# Patient Record
Sex: Female | Born: 1985
Health system: Southern US, Community
[De-identification: ages and names within clinical notes are randomized; demographics above are authoritative.]

## PROBLEM LIST (undated history)

## (undated) DIAGNOSIS — Z801 Family history of malignant neoplasm of trachea, bronchus and lung: Secondary | ICD-10-CM

## (undated) DIAGNOSIS — Z800 Family history of malignant neoplasm of digestive organs: Secondary | ICD-10-CM

## (undated) DIAGNOSIS — Z83719 Family history of colon polyps, unspecified: Secondary | ICD-10-CM

## (undated) DIAGNOSIS — Z8371 Family history of colonic polyps: Secondary | ICD-10-CM

## (undated) DIAGNOSIS — Z720 Tobacco use: Secondary | ICD-10-CM

## (undated) DIAGNOSIS — Z803 Family history of malignant neoplasm of breast: Secondary | ICD-10-CM

## (undated) DIAGNOSIS — Z8481 Family history of carrier of genetic disease: Secondary | ICD-10-CM

## (undated) DIAGNOSIS — Z8052 Family history of malignant neoplasm of bladder: Secondary | ICD-10-CM

## (undated) DIAGNOSIS — R000 Tachycardia, unspecified: Secondary | ICD-10-CM

## (undated) DIAGNOSIS — Z808 Family history of malignant neoplasm of other organs or systems: Secondary | ICD-10-CM

## (undated) HISTORY — DX: Family history of malignant neoplasm of breast: Z80.3

## (undated) HISTORY — DX: Family history of colonic polyps: Z83.71

## (undated) HISTORY — DX: Tachycardia, unspecified: R00.0

## (undated) HISTORY — DX: Family history of malignant neoplasm of other organs or systems: Z80.8

## (undated) HISTORY — DX: Family history of malignant neoplasm of digestive organs: Z80.0

## (undated) HISTORY — PX: KNEE ARTHROSCOPY: SUR90

## (undated) HISTORY — DX: Family history of colon polyps, unspecified: Z83.719

## (undated) HISTORY — DX: Tobacco use: Z72.0

## (undated) HISTORY — DX: Family history of malignant neoplasm of bladder: Z80.52

## (undated) HISTORY — DX: Family history of carrier of genetic disease: Z84.81

## (undated) HISTORY — DX: Family history of malignant neoplasm of trachea, bronchus and lung: Z80.1

---
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
## 2004-01-19 ENCOUNTER — Inpatient Hospital Stay (HOSPITAL_COMMUNITY): Admission: AD | Admit: 2004-01-19 | Discharge: 2004-01-19 | Payer: Self-pay | Admitting: Obstetrics & Gynecology

## 2004-01-19 ENCOUNTER — Inpatient Hospital Stay (HOSPITAL_COMMUNITY): Admission: AD | Admit: 2004-01-19 | Discharge: 2004-01-22 | Payer: Self-pay | Admitting: Obstetrics & Gynecology

## 2004-01-24 ENCOUNTER — Inpatient Hospital Stay (HOSPITAL_COMMUNITY): Admission: AD | Admit: 2004-01-24 | Discharge: 2004-01-24 | Payer: Self-pay | Admitting: Obstetrics and Gynecology

## 2006-11-15 ENCOUNTER — Emergency Department (HOSPITAL_COMMUNITY): Admission: EM | Admit: 2006-11-15 | Discharge: 2006-11-16 | Payer: Self-pay | Admitting: Emergency Medicine

## 2007-01-07 ENCOUNTER — Emergency Department (HOSPITAL_COMMUNITY): Admission: EM | Admit: 2007-01-07 | Discharge: 2007-01-07 | Payer: Self-pay | Admitting: Emergency Medicine

## 2007-09-14 ENCOUNTER — Encounter: Admission: RE | Admit: 2007-09-14 | Discharge: 2007-09-14 | Payer: Self-pay | Admitting: Family Medicine

## 2011-02-20 NOTE — Discharge Summary (Signed)
NAME:  Linda [name], Linda [name]                          ACCOUNT NO.:  [id number]   MEDICAL RECORD NO.:  [id number]                   PATIENT TYPE:  INP   LOCATION:  9110                                 FACILITY:  [facility]   PHYSICIAN:  Gerrit Friends. Aldona Bar, M.D.                DATE OF BIRTH:  January 08, 1986   DATE OF ADMISSION:  01/19/2004  DATE OF DISCHARGE:  01/22/2004                                 DISCHARGE SUMMARY   DISCHARGE DIAGNOSES:  1. Term pregnancy, delivered, 7-pound 15-ounce female infant, Apgars 9 and 9.  2. Blood type A positive.   PROCEDURES:  1. Normal spontaneous delivery.  2. Second degree tear and repair.   SUMMARY:  This primigravida presented at term in early labor and progressed  well.  Her pregnancy was complicated by the demise of a twin in the first  trimester and early part of the second trimester.  Her subsequent pregnancy  was benign.   She had a normal spontaneous delivery of a viable female infant over a second  degree tear.  The baby had Apgars of 9 and 9 and weighed 7 pounds 15 ounces.  The tear was repaired without difficulty and her postpartum course was  totally benign.  Her discharge hemoglobin was 9.9 with a white count of  10,400 and a platelet count of 206,000.  On the morning of January 22, 2004  she was ambulating well, tolerating a regular diet well, having normal bowel  and bladder function, was afebrile, was tolerating her pain medication well,  her breastfeeding was doing well, and she was deemed ready for discharge.  Accordingly she was given all appropriate instructions and understood all  instructions well.   DISCHARGE MEDICATIONS:  1. Vitamins one a day.  2. Ferrous sulfate 300 mg daily.  3. Motrin 600 mg q.6h. as needed for pain.  4. Tylox one to two q.4-6h. as needed for more severe pain.   She will return to the office for follow-up in about 4 weeks or as needed.   CONDITION ON DISCHARGE:  Improved.     Gerrit Friends. Aldona Bar, M.D.    RMW/MEDQ  D:  01/22/2004  T:  01/22/2004  Job:  [id number]

## 2011-03-26 ENCOUNTER — Other Ambulatory Visit: Payer: Self-pay | Admitting: General Practice

## 2012-05-10 ENCOUNTER — Emergency Department: Payer: Self-pay | Admitting: Emergency Medicine

## 2012-05-10 LAB — URINALYSIS, COMPLETE
Bacteria: NONE SEEN
Bilirubin,UR: NEGATIVE
Ketone: NEGATIVE
Nitrite: NEGATIVE
Protein: NEGATIVE
RBC,UR: NONE SEEN /HPF (ref 0–5)
Specific Gravity: 1.012 (ref 1.003–1.030)
Squamous Epithelial: <1
WBC UR: <1 /HPF (ref 0–5)

## 2013-06-30 ENCOUNTER — Ambulatory Visit: Payer: Self-pay | Admitting: Cardiovascular Disease

## 2013-07-06 ENCOUNTER — Ambulatory Visit (INDEPENDENT_AMBULATORY_CARE_PROVIDER_SITE_OTHER): Payer: 59 | Admitting: Cardiovascular Disease

## 2013-07-06 ENCOUNTER — Encounter: Payer: Self-pay | Admitting: Cardiovascular Disease

## 2013-07-06 VITALS — BP 118/92 | HR 107 | Ht 65.0 in | Wt 178.0 lb

## 2013-07-06 DIAGNOSIS — R000 Tachycardia, unspecified: Secondary | ICD-10-CM | POA: Insufficient documentation

## 2013-07-06 DIAGNOSIS — I498 Other specified cardiac arrhythmias: Secondary | ICD-10-CM

## 2013-07-06 MED ORDER — METOPROLOL SUCCINATE ER 25 MG PO TB24: 25.0000 mg | ORAL_TABLET | Freq: Every day | ORAL | Status: DC

## 2013-07-06 NOTE — Assessment & Plan Note (Addendum)
Notes reviewed from urgent care, prior EKG reviewed . Appears to be chronic sinus tachycardia, not WPW as she was concerned about given the family history (brother with WPW). She does not have any significant episodes of tachycardia and is not symptomatic. For rate control, we have suggested she start on low-dose metoprolol succinate 12.5 mg daily and we will do a slow titration upwards for rate control. Good physical exam, no further workup has been ordered at this time. If symptoms get worse, could order a Holter monitor. We have suggested that she call if she would like this.

## 2013-07-06 NOTE — Progress Notes (Signed)
   Patient ID: Linda [name], female    DOB: Apr 06, 1986, 27 y.o.   MRN: [id number]  HPI Comments: Ms Goodgame is a 27 year old woman with long history of smoking, chronic tachycardia at baseline per the patient with heart rates typically in the 100-110 range, asymptomatic, who presents for evaluation of her tachycardia.  She reports recent diagnosis of acute bronchitis. Evaluation at urgent care, heart rate was 123. She reports that on a pulse meter, heart rate was measured at more than 200 beats per minute. She was not particularly symptomatic apart from a cough from her bronchitis. EKG showed sinus tachycardia with rate 123 beats per minute. She was started on antibiotics, cough suppressant, albuterol inhaler.  She is concerned as her brother has history of WPW and has had ablations in the past. He is very symptomatic when he has his tachycardia.  She does not describe episodes of severe tachycardia, only typically with monitoring her heart rate. She does not have shortness of breath with exertion. 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 No problems in the past with running around, shopping, keeping up with her child.  EKG today shows normal sinus rhythm with rate 107 beats per minute, no delta waves EKG from urgent care evaluated dated 06/29/2013 showing normal sinus rhythm with rate 123 beats per minute with no significant ST or T wave changes   Outpatient Encounter Prescriptions as of 07/06/2013  Medication Sig Dispense Refill  . albuterol (PROVENTIL) (2.5 MG/3ML) 0.083% nebulizer solution Take 2.5 mg by nebulization every 6 (six) hours as needed for wheezing.        Review of Systems  Constitutional: Negative.   HENT: Negative.   Eyes: Negative.   Respiratory: Negative.   Cardiovascular: Negative.        Tachycardia  Gastrointestinal: Negative.   Endocrine: Negative.   Musculoskeletal: Negative.   Skin: Negative.   Allergic/Immunologic: Negative.   Neurological: Negative.    Hematological: Negative.   Psychiatric/Behavioral: Negative.   All other systems reviewed and are negative.    BP 118/92  Pulse 107  Ht 5\' 5"  (1.651 m)  Wt 178 lb (80.74 kg)  BMI 29.62 kg/m2   Physical Exam  Nursing note and vitals reviewed. Constitutional: She is oriented to person, place, and time. She appears well-developed and well-nourished.  HENT:  Head: Normocephalic.  Nose: Nose normal.  Mouth/Throat: Oropharynx is clear and moist.  Eyes: Conjunctivae are normal. Pupils are equal, round, and reactive to light.  Neck: Normal range of motion. Neck supple. No JVD present.  Cardiovascular: Regular rhythm, S1 normal, S2 normal, normal heart sounds and intact distal pulses.  Tachycardia present.  Exam reveals no gallop and no friction rub.   No murmur heard. Pulmonary/Chest: Effort normal and breath sounds normal. No respiratory distress. She has no wheezes. She has no rales. She exhibits no tenderness.  Abdominal: Soft. Bowel sounds are normal. She exhibits no distension. There is no tenderness.  Musculoskeletal: Normal range of motion. She exhibits no edema and no tenderness.  Lymphadenopathy:    She has no cervical adenopathy.  Neurological: She is alert and oriented to person, place, and time. Coordination normal.  Skin: Skin is warm and dry. No rash noted. No erythema.  Psychiatric: She has a normal mood and affect. Her behavior is normal. Judgment and thought content normal.    Assessment and Plan

## 2013-07-06 NOTE — Patient Instructions (Addendum)
Please start metoprolol succinate 1/2 pill daily  Ok to increase to a full pill if heart rate continues to be fast  Goal heart rate 70 to 80s  Download apps, pulse meter apps (cardionet, instant heart rate) Or buy a pulse meter  Please call us if you have new issues that need to be addressed before your next appt.  Your physician wants you to follow-up in: 6 months.  You will receive a reminder letter in the mail two months in advance. If you don't receive a letter, please call our office to schedule the follow-up appointment.

## 2013-07-20 ENCOUNTER — Telehealth: Payer: Self-pay | Admitting: *Deleted

## 2013-07-20 NOTE — Telephone Encounter (Signed)
Patient start wheezing since up Metoprolol please advise

## 2013-07-20 NOTE — Telephone Encounter (Signed)
Forwarding to MD

## 2013-07-23 NOTE — Telephone Encounter (Signed)
Would wait until bronchits resolved, then could retry metoprolol In setting of smoking and resolving bronchitis, metoprolol may make breathing worse. We would try alternate b-blocker bystolic, though this is not generic (5 mg titrating up to 10 mg daily)

## 2013-07-24 NOTE — Telephone Encounter (Signed)
Spoke w/ pt.  She states that her bronchitis is "gone" but she is still c/o wheezing, which is worse at night.  Reports that wheezing started after she increased metoprolol from 12.5mg  up to 25mg . She is not interested in bystolic, wants to know if "anything can be done about the wheezing with changing my medicine". Please advise.  Thank you.

## 2013-07-24 NOTE — Telephone Encounter (Signed)
Would definitely stop smoking Could try xopenex inhaler (rescue inhaler) Would try to stay away from albuterol inhaler as this can cause tachycardia If no improvement, would touch base with PMD

## 2013-07-24 NOTE — Telephone Encounter (Signed)
Lmom to call back. 

## 2013-07-25 NOTE — Telephone Encounter (Signed)
lmom for pt to call back

## 2013-07-26 NOTE — Telephone Encounter (Signed)
Spoke w/ pt.  She states that her wheezing is persisting, particularly at night.  Encourage pt to contact her PCP.  She is agreeable to this plan.

## 2013-09-23 ENCOUNTER — Telehealth: Payer: Self-pay | Admitting: Physician Assistant

## 2013-09-23 MED ORDER — METOPROLOL SUCCINATE ER 25 MG PO TB24: 25.0000 mg | ORAL_TABLET | Freq: Every day | ORAL | Status: DC

## 2013-09-23 NOTE — Telephone Encounter (Signed)
Pt called on Saturday. She went to pick up Toprol XL 25mg  daily rx but realized [facility] is closed on the weekend. Only needs a few tablets. I e-rx'd 5 tablets to the [facility] in [place] per her request. Ronie Spies PA-C

## 2013-11-27 ENCOUNTER — Telehealth: Payer: Self-pay

## 2013-11-27 NOTE — Telephone Encounter (Signed)
Pt states just recently she has started having tachycardia. Pt states she is currently on Toprol. Pt states she works [employer] it is hard to reach her, and give permission to leave message on vm.

## 2013-11-29 NOTE — Telephone Encounter (Signed)
Left detailed message for pt w/ Dr. Windell HummingbirdGollan's recommendations. Asked her to call w/ further questions or concerns.

## 2013-11-29 NOTE — Telephone Encounter (Signed)
Ok to increase the metoprolol,  Though I would increase slowly by 1/2 tabs only Would monitor blood pressure with higher doses of b-blocker

## 2013-11-29 NOTE — Telephone Encounter (Signed)
Spoke w/ pt.  She reports that her HR has previously been in the 70-80s, but has been creeping up to the 95-100 range.  She reports that she smokes about 6-10 cigarettes today, but her HR has been steadily up. She is currently taking a whole toprol 25mg  daily and would like to know if she should increase this anymore.  Please advise.  Thank you!

## 2014-01-04 ENCOUNTER — Ambulatory Visit: Payer: 59 | Admitting: Cardiovascular Disease

## 2014-01-12 ENCOUNTER — Ambulatory Visit: Payer: 59 | Admitting: Cardiovascular Disease

## 2014-01-25 ENCOUNTER — Encounter: Payer: Self-pay | Admitting: Cardiovascular Disease

## 2014-01-25 ENCOUNTER — Ambulatory Visit (INDEPENDENT_AMBULATORY_CARE_PROVIDER_SITE_OTHER): Payer: 59 | Admitting: Cardiovascular Disease

## 2014-01-25 VITALS — BP 120/68 | HR 84 | Ht 66.0 in | Wt 181.8 lb

## 2014-01-25 DIAGNOSIS — I498 Other specified cardiac arrhythmias: Secondary | ICD-10-CM

## 2014-01-25 DIAGNOSIS — R000 Tachycardia, unspecified: Secondary | ICD-10-CM

## 2014-01-25 DIAGNOSIS — F17200 Nicotine dependence, unspecified, uncomplicated: Secondary | ICD-10-CM | POA: Insufficient documentation

## 2014-01-25 MED ORDER — METOPROLOL SUCCINATE ER 25 MG PO TB24: 25.0000 mg | ORAL_TABLET | Freq: Every day | ORAL | Status: DC

## 2014-01-25 MED ORDER — PROPRANOLOL HCL 10 MG PO TABS: 10.0000 mg | ORAL_TABLET | Freq: Three times a day (TID) | ORAL | Status: DC | PRN

## 2014-01-25 NOTE — Assessment & Plan Note (Signed)
Heart rate well controlled on current dose of metoprolol succinate 25 mg daily. We have given her propranolol 10 mg to take as needed for breakthrough tachycardia.

## 2014-01-25 NOTE — Progress Notes (Signed)
   Patient ID: Linda [name], female    DOB: 09/10/1986, 28 y.o.   MRN: [id number]  HPI Comments: Ms Linda MeckelDoman is a 28 year old woman with long history of smoking, chronic tachycardia at baseline per the patient with heart rates typically in the 100-110 range, asymptomatic, who presents for evaluation of her tachycardia.  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 She tried extra half dose of this caused dizziness. She takes the 25 mg pill before bed daily. Occasionally has fast heart beat in the setting of stress. 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  No problems in the past with running around, shopping, keeping up with her child.  She was previously concerned as her brother has history of WPW and has had ablations in the past. He is very symptomatic when he has his tachycardia.  Prior EKG EKG from urgent care evaluated dated 06/29/2013 showing normal sinus rhythm with rate 123 beats per minute with no significant ST or T wave changes EKG today shows normal sinus rhythm with rate 84 beats per minute, no significant ST or T wave changes   Outpatient Encounter Prescriptions as of 01/25/2014  Medication Sig  . albuterol (PROVENTIL) (2.5 MG/3ML) 0.083% nebulizer solution Take 2.5 mg by nebulization every 6 (six) hours as needed for wheezing.  . metoprolol succinate (TOPROL XL) 25 MG 24 hr tablet Take 1 tablet (25 mg total) by mouth daily.    Review of Systems  Constitutional: Negative.   HENT: Negative.   Eyes: Negative.   Respiratory: Negative.   Cardiovascular: Negative.   Gastrointestinal: Negative.   Endocrine: Negative.   Musculoskeletal: Negative.   Skin: Negative.   Allergic/Immunologic: Negative.   Neurological: Negative.   Hematological: Negative.   Psychiatric/Behavioral: Negative.   All other systems reviewed and are negative.   BP 120/68  Pulse 84  Ht 5\' 6"  (1.676 m)  Wt 181 lb 12 oz  (82.441 kg)  BMI 29.35 kg/m2   Physical Exam  Nursing note and vitals reviewed. Constitutional: She is oriented to person, place, and time. She appears well-developed and well-nourished.  HENT:  Head: Normocephalic.  Nose: Nose normal.  Mouth/Throat: Oropharynx is clear and moist.  Eyes: Conjunctivae are normal. Pupils are equal, round, and reactive to light.  Neck: Normal range of motion. Neck supple. No JVD present.  Cardiovascular: Regular rhythm, S1 normal, S2 normal, normal heart sounds and intact distal pulses.  Tachycardia present.  Exam reveals no gallop and no friction rub.   No murmur heard. Pulmonary/Chest: Effort normal and breath sounds normal. No respiratory distress. She has no wheezes. She has no rales. She exhibits no tenderness.  Abdominal: Soft. Bowel sounds are normal. She exhibits no distension. There is no tenderness.  Musculoskeletal: Normal range of motion. She exhibits no edema and no tenderness.  Lymphadenopathy:    She has no cervical adenopathy.  Neurological: She is alert and oriented to person, place, and time. Coordination normal.  Skin: Skin is warm and dry. No rash noted. No erythema.  Psychiatric: She has a normal mood and affect. Her behavior is normal. Judgment and thought content normal.    Assessment and Plan

## 2014-01-25 NOTE — Patient Instructions (Signed)
You are doing well. No medication changes were made. Stay on metoprolol one a day  Please take propranolol 1 to 2 pills as needed for breakthrough tachycardia  Please call us if you have new issues that need to be addressed before your next appt.  Your physician wants you to follow-up in: 12 months.  You will receive a reminder letter in the mail two months in advance. If you don't receive a letter, please call our office to schedule the follow-up appointment.

## 2014-01-25 NOTE — Assessment & Plan Note (Signed)
We have encouraged her to continue to work on weaning her cigarettes and smoking cessation. She will continue to work on this and does not want any assistance with chantix.  

## 2014-06-28 ENCOUNTER — Emergency Department: Payer: Self-pay | Admitting: Emergency Medicine

## 2014-06-28 LAB — CBC
HCT: 39 % (ref 35.0–47.0)
HGB: 12.2 g/dL (ref 12.0–16.0)
MCH: 27.5 pg (ref 26.0–34.0)
MCHC: 31.3 g/dL — ABNORMAL LOW (ref 32.0–36.0)
MCV: 88 fL (ref 80–100)
Platelet: 253 10*3/uL (ref 150–440)
RBC: 4.44 10*6/uL (ref 3.80–5.20)
RDW: 13 % (ref 11.5–14.5)
WBC: 7.6 10*3/uL (ref 3.6–11.0)

## 2014-06-28 LAB — TROPONIN I

## 2014-06-28 LAB — BASIC METABOLIC PANEL
Anion Gap: 6 — ABNORMAL LOW (ref 7–16)
BUN: 11 mg/dL (ref 7–18)
CALCIUM: 8.3 mg/dL — AB (ref 8.5–10.1)
CHLORIDE: 107 mmol/L (ref 98–107)
Co2: 27 mmol/L (ref 21–32)
Creatinine: 0.68 mg/dL (ref 0.60–1.30)
Glucose: 109 mg/dL — ABNORMAL HIGH (ref 65–99)
Osmolality: 279 (ref 275–301)
POTASSIUM: 4.1 mmol/L (ref 3.5–5.1)
Sodium: 140 mmol/L (ref 136–145)

## 2014-06-28 LAB — D-DIMER(ARMC): D-Dimer: 138 ng/ml

## 2015-01-18 ENCOUNTER — Telehealth: Payer: Self-pay | Admitting: *Deleted

## 2015-01-18 MED ORDER — METOPROLOL SUCCINATE ER 25 MG PO TB24: 25.0000 mg | ORAL_TABLET | Freq: Every day | ORAL | Status: DC

## 2015-01-18 NOTE — Telephone Encounter (Signed)
Refill sent for metoprolol.  

## 2015-01-18 NOTE — Telephone Encounter (Signed)
°  1. Which medications need to be refilled? Metoprolol   2. Which pharmacy is medication to be sent to? [facility]  3. Do they need a 30 day or 90 day supply? 30 day or enough until apt  4. Would they like a call back once the medication has been sent to the pharmacy? no

## 2015-02-08 ENCOUNTER — Ambulatory Visit: Payer: 59 | Admitting: Cardiovascular Disease

## 2015-02-11 ENCOUNTER — Encounter: Payer: Self-pay | Admitting: Cardiovascular Disease

## 2015-02-11 ENCOUNTER — Ambulatory Visit (INDEPENDENT_AMBULATORY_CARE_PROVIDER_SITE_OTHER): Payer: 59 | Admitting: Cardiovascular Disease

## 2015-02-11 VITALS — BP 120/80 | HR 92 | Ht 65.0 in | Wt 164.5 lb

## 2015-02-11 DIAGNOSIS — E785 Hyperlipidemia, unspecified: Secondary | ICD-10-CM

## 2015-02-11 DIAGNOSIS — Z720 Tobacco use: Secondary | ICD-10-CM

## 2015-02-11 DIAGNOSIS — I471 Supraventricular tachycardia: Secondary | ICD-10-CM

## 2015-02-11 DIAGNOSIS — R000 Tachycardia, unspecified: Secondary | ICD-10-CM

## 2015-02-11 DIAGNOSIS — F17200 Nicotine dependence, unspecified, uncomplicated: Secondary | ICD-10-CM

## 2015-02-11 MED ORDER — METOPROLOL SUCCINATE ER 25 MG PO TB24: 25.0000 mg | ORAL_TABLET | Freq: Two times a day (BID) | ORAL | Status: DC

## 2015-02-11 NOTE — Assessment & Plan Note (Signed)
We have encouraged her to continue to work on weaning her cigarettes and smoking cessation. She will continue to work on this and does not want any assistance with chantix.  

## 2015-02-11 NOTE — Assessment & Plan Note (Signed)
Heart rate well controlled on her current medication regimen Suggested she could try metoprolol succinate 50 mg if extra rate control as needed

## 2015-02-11 NOTE — Progress Notes (Signed)
Patient ID: Gunnar BullaJamie D Ertel, female    DOB: 11/14/1985, 29 y.o.   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 On this regimen, she feels relatively asymptomatic. Occasional episodes of palpitations, usually associated with anxiety. 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 He is very symptomatic when he has his tachycardia.  Allergies  Allergen Reactions  . Penicillins     Current Outpatient Prescriptions on File Prior to Visit  Medication Sig Dispense Refill  . albuterol (PROVENTIL) (2.5 MG/3ML) 0.083% nebulizer solution Take 2.5 mg by nebulization every 6 (six) hours as needed for wheezing.    . propranolol (INDERAL) 10 MG tablet Take 1 tablet (10 mg total) by mouth 3 (three) times daily as needed. 90 tablet 1   No current facility-administered medications on file prior to visit.    Past Medical History  Diagnosis Date  . Tachycardia     Past Surgical History  Procedure Laterality Date  . Knee arthroscopy      x2    Social History  reports that she has been smoking.  She does not have any smokeless tobacco history on file. She reports that she drinks alcohol. She reports that she does not use illicit drugs.  Family History family history includes Heart attack in her father; Heart disease in her father; Hyperlipidemia in her father and  mother; Hypertension in her father.  Review of Systems  Constitutional: Negative.   Respiratory: Negative.   Cardiovascular: Negative.   Gastrointestinal: Negative.   Musculoskeletal: Negative.   Skin: Negative.   Neurological: Negative.   Hematological: Negative.   Psychiatric/Behavioral: Negative.   All other systems reviewed and are negative.   BP 120/80 mmHg  Pulse 92  Ht 5\' 5"  (1.651 m)  Wt 164 lb 8 oz (74.617 kg)  BMI 27.37 kg/m2   Physical Exam  Constitutional: She is oriented to person, place, and time. She appears well-developed and well-nourished.  HENT:  Head: Normocephalic.  Nose: Nose normal.  Mouth/Throat: Oropharynx is clear and moist.  Eyes: Conjunctivae are normal. Pupils are equal, round, and reactive to light.  Neck: Normal range of motion. Neck supple. No JVD present.  Cardiovascular: Regular rhythm, S1 normal, S2 normal, normal heart sounds and intact distal pulses.  Tachycardia present.  Exam reveals no gallop and no friction rub.   No murmur heard. Pulmonary/Chest: Effort normal and breath sounds normal. No respiratory distress. She has no wheezes. She has no rales. She exhibits no tenderness.  Abdominal: Soft. Bowel sounds are normal. She exhibits no distension. There is no tenderness.  Musculoskeletal: Normal range of motion. She exhibits no edema or tenderness.  Lymphadenopathy:    She has no cervical adenopathy.  Neurological: She is alert and oriented to person, place, and time. Coordination normal.  Skin: Skin is warm and dry. No rash noted. No erythema.  Psychiatric: She  has a normal mood and affect. Her behavior is normal. Judgment and thought content normal.    Assessment and Plan  Nursing note and vitals reviewed.

## 2015-02-11 NOTE — Patient Instructions (Signed)
You are doing well. No medication changes were made.  We will take labs today, lipids Ok to try extra metoprolol, up to 50 mg daily  Please call us if you have new issues that need to be addressed before your next appt.  Your physician wants you to follow-up in: 12 months.  You will receive a reminder letter in the mail two months in advance. If you don't receive a letter, please call our office to schedule the follow-up appointment.

## 2015-02-12 LAB — LIPID PANEL
CHOL/HDL RATIO: 3.8 ratio (ref 0.0–4.4)
CHOLESTEROL TOTAL: 212 mg/dL — AB (ref 100–199)
HDL: 56 mg/dL (ref >39)
LDL Calculated: 133 mg/dL — ABNORMAL HIGH (ref 0–99)
Triglycerides: 114 mg/dL (ref 0–149)
VLDL Cholesterol Cal: 23 mg/dL (ref 5–40)

## 2015-04-12 ENCOUNTER — Encounter: Payer: Self-pay | Admitting: Emergency Medicine

## 2015-04-12 ENCOUNTER — Emergency Department
Admission: EM | Admit: 2015-04-12 | Discharge: 2015-04-12 | Disposition: A | Discharge status: Home / Self Care | Payer: 59 | Attending: Emergency Medicine | Admitting: Emergency Medicine

## 2015-04-12 ENCOUNTER — Emergency Department: Payer: 59

## 2015-04-12 ENCOUNTER — Other Ambulatory Visit: Payer: Self-pay

## 2015-04-12 DIAGNOSIS — Z793 Long term (current) use of hormonal contraceptives: Secondary | ICD-10-CM | POA: Diagnosis not present

## 2015-04-12 DIAGNOSIS — Z79899 Other long term (current) drug therapy: Secondary | ICD-10-CM | POA: Insufficient documentation

## 2015-04-12 DIAGNOSIS — R079 Chest pain, unspecified: Secondary | ICD-10-CM | POA: Insufficient documentation

## 2015-04-12 DIAGNOSIS — Z880 Allergy status to penicillin: Secondary | ICD-10-CM | POA: Insufficient documentation

## 2015-04-12 DIAGNOSIS — Z720 Tobacco use: Secondary | ICD-10-CM | POA: Diagnosis not present

## 2015-04-12 LAB — CBC WITH DIFFERENTIAL/PLATELET
Basophils Absolute: 0 10*3/uL (ref 0–0.1)
Basophils Relative: 0 %
Eosinophils Absolute: 0.1 10*3/uL (ref 0–0.7)
Eosinophils Relative: 2 %
HCT: 39.1 % (ref 35.0–47.0)
Hemoglobin: 12.9 g/dL (ref 12.0–16.0)
LYMPHS ABS: 1.8 10*3/uL (ref 1.0–3.6)
Lymphocytes Relative: 33 %
MCH: 28.6 pg (ref 26.0–34.0)
MCHC: 33 g/dL (ref 32.0–36.0)
MCV: 86.7 fL (ref 80.0–100.0)
MONOS PCT: 6 %
Monocytes Absolute: 0.3 10*3/uL (ref 0.2–0.9)
NEUTROS ABS: 3.1 10*3/uL (ref 1.4–6.5)
NEUTROS PCT: 59 %
Platelets: 253 10*3/uL (ref 150–440)
RBC: 4.52 MIL/uL (ref 3.80–5.20)
RDW: 13 % (ref 11.5–14.5)
WBC: 5.3 10*3/uL (ref 3.6–11.0)

## 2015-04-12 LAB — TROPONIN I
Troponin I: <0.03 ng/mL (ref <0.031)
Troponin I: <0.03 ng/mL (ref <0.031)

## 2015-04-12 LAB — BASIC METABOLIC PANEL
ANION GAP: 6 (ref 5–15)
BUN: 14 mg/dL (ref 6–20)
CO2: 25 mmol/L (ref 22–32)
Calcium: 8.9 mg/dL (ref 8.9–10.3)
Chloride: 107 mmol/L (ref 101–111)
Creatinine, Ser: 0.69 mg/dL (ref 0.44–1.00)
GFR calc Af Amer: >60 mL/min (ref >60)
Glucose, Bld: 86 mg/dL (ref 65–99)
Potassium: 3.7 mmol/L (ref 3.5–5.1)
SODIUM: 138 mmol/L (ref 135–145)

## 2015-04-12 NOTE — Discharge Instructions (Signed)
No certain cause was found for your chest pain episode, however your exam and evaluation are reassuring today. We discussed return to the emergency department for any new or worsening condition including chest pain, trouble breathing, shortness of breath, sweating, nausea, fever, numbness, weakness, or any other symptoms concerning to you. Follow-up with your cardiologist called today to make an appointment for next week.   Chest Pain (Nonspecific) It is often hard to give a diagnosis for the cause of chest pain. There is always a chance that your pain could be related to something serious, such as a heart attack or a blood clot in the lungs. You need to follow up with your doctor. HOME CARE  If antibiotic medicine was given, take it as directed by your doctor. Finish the medicine even if you start to feel better.  For the next few days, avoid activities that bring on chest pain. Continue physical activities as told by your doctor.  Do not use any tobacco products. This includes cigarettes, chewing tobacco, and e-cigarettes.  Avoid drinking alcohol.  Only take medicine as told by your doctor.  Follow your doctor's suggestions for more testing if your chest pain does not go away.  Keep all doctor visits you made. GET HELP IF:  Your chest pain does not go away, even after treatment.  You have a rash with blisters on your chest.  You have a fever. GET HELP RIGHT AWAY IF:   You have more pain or pain that spreads to your arm, neck, jaw, back, or belly (abdomen).  You have shortness of breath.  You cough more than usual or cough up blood.  You have very bad back or belly pain.  You feel sick to your stomach (nauseous) or throw up (vomit).  You have very bad weakness.  You pass out (faint).  You have chills. This is an emergency. Do not wait to see if the problems will go away. Call your local emergency services (911 in [place] Do not drive yourself to the hospital. MAKE SURE  YOU:   Understand these instructions.  Will watch your condition.  Will get help right away if you are not doing well or get worse. Document Released: 03/09/2008 Document Revised: 09/26/2013 Document Reviewed: 03/09/2008 [facility] Patient Information 2015 [employer]. This information is not intended to replace advice given to you by your health care provider. Make sure you discuss any questions you have with your health care provider.

## 2015-04-12 NOTE — ED Provider Notes (Addendum)
[facility] Emergency Department Provider Note   ____________________________________________  Time seen: On arrival to room I have reviewed the triage vital signs and the triage nursing note.  HISTORY  Chief Complaint Chest Pain   Historian Patient  HPI Linda [name] is a 29 y.o. female who is complaining of chest pain. 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 She has had chest pains in the past, but this seemed a little stronger and radiated up into the neck which is different. She has a history of tachycardia for which she takes metoprolol. She follows with the cardiologist Dr. Mariah MillingGollan. When the chest discomfort started she was feeling a little lightheaded. She had no shortness of breath. No nausea, vomiting, or sweating. Symptoms are improving now but she still has some chest pressure.    Past Medical History  Diagnosis Date  . Tachycardia     Patient Active Problem List   Diagnosis Date Noted  . Smoker 01/25/2014  . Sinus tachycardia 07/06/2013    Past Surgical History  Procedure Laterality Date  . Knee arthroscopy      x2    Current Outpatient Rx  Name  Route  Sig  Dispense  Refill  . metoprolol succinate (TOPROL XL) 25 MG 24 hr tablet   Oral   Take 1 tablet (25 mg total) by mouth 2 (two) times daily.   180 tablet   3   . norgestimate-ethinyl estradiol (ORTHO-CYCLEN,SPRINTEC,PREVIFEM) 0.25-35 MG-MCG tablet   Oral   Take 1 tablet by mouth daily.         Marland Kitchen. albuterol (PROVENTIL) (2.5 MG/3ML) 0.083% nebulizer solution   Nebulization   Take 2.5 mg by nebulization every 6 (six) hours as needed for wheezing.         . propranolol (INDERAL) 10 MG tablet   Oral   Take 1 tablet (10 mg total) by mouth 3 (three) times daily as needed. Patient not taking: Reported on 04/12/2015   90 tablet   1     Allergies Penicillins  Family  History  Problem Relation Age of Onset  . Hyperlipidemia Mother   . Heart attack Father   . Heart disease Father   . Hypertension Father   . Hyperlipidemia Father     Social History History  Substance Use Topics  . Smoking status: Current Every Day Smoker -- 0.25 packs/day for 12 years  . Smokeless tobacco: Not on file  . Alcohol Use: Yes     Comment: occassional    Review of Systems  Constitutional: Negative for fever. Eyes: Negative for visual changes. ENT: Negative for sore throat. Cardiovascular: Chest pain per history of present illness Respiratory: Negative for shortness of breath. Gastrointestinal: Negative for abdominal pain, vomiting and diarrhea. Genitourinary: Negative for dysuria. Musculoskeletal: Negative for back pain. Skin: Negative for rash. Neurological: Negative for headaches, focal weakness or numbness. 10 point Review of Systems otherwise negative ____________________________________________   PHYSICAL EXAM:  VITAL SIGNS: ED Triage Vitals  Enc Vitals Group     BP 04/12/15 0819 127/88 mmHg     Pulse Rate 04/12/15 0819 77     Resp 04/12/15 0819 18     Temp 04/12/15 0819 98.2 F (36.8 C)     Temp Source 04/12/15 0819 Oral     SpO2 04/12/15 0819 99 %     Weight 04/12/15 0816 170 lb (77.111 kg)  Height 04/12/15 0816  (1.651 m)     Head Cir --      Peak Flow --      Pain Score 04/12/15 0817 4     Pain Loc --      Pain Edu? --      Excl. in GC? --      Constitutional: Alert and oriented. Well appearing and in no distress. Eyes: Conjunctivae are normal. PERRL. Normal extraocular movements. ENT   Head: Normocephalic and atraumatic.   Nose: No congestion/rhinnorhea.   Mouth/Throat: Mucous membranes are moist.   Neck: No stridor. Cardiovascular/Chest: Normal rate, regular rhythm.  No murmurs, rubs, or gallops. Respiratory: Normal respiratory effort without tachypnea nor retractions. Breath sounds are clear and equal  bilaterally. No wheezes/rales/rhonchi. Gastrointestinal: Soft. No distention, no guarding, no rebound. Nontender  Genitourinary/rectal:Deferred Musculoskeletal: Nontender with normal range of motion in all extremities. No joint effusions.  No lower extremity tenderness nor edema. Neurologic:  Normal speech and language. No gross focal neurologic deficits are appreciated. Skin:  Skin is warm, dry and intact. No rash noted. Psychiatric: Mood and affect are normal. Speech and behavior are normal. Patient exhibits appropriate insight and judgment.  ____________________________________________   EKG I, Governor Rooks, MD, attending physician have personally viewed and interpreted all ECGs.  77 bpm. Normal sinus rhythm. Narrow QRS. Normal axis. Normal ST and T-wave  #2 at 1305:71 bpm. Normal sinus rhythm. Narrow QS. Normal axis. Normal ST and T-wave. ____________________________________________  LABS (pertinent positives/negatives)  Metabolic panel within normal limits Troponin negative at less than 0.03 CBC within normal limits Repeat troponin negative at less than 0.03  ____________________________________________  RADIOLOGY All Xrays were viewed by me. Imaging interpreted by Radiologist.  Chest x-ray: Negative  __________________________________________  PROCEDURES  Procedure(s) performed: None Critical Care performed: None  ____________________________________________   ED COURSE / ASSESSMENT AND PLAN  CONSULTATIONS: None   Pertinent labs & imaging results that were available during my care of the patient were reviewed by me and considered in my medical decision making (see chart for details).  Patient is young and healthy with a chest pain episode which was not pleuritic or associated with any respiratory symptoms or any additional symptoms and resolved on its own. Patient has had a history of chest pains in the past and tachycardia and it does follow with a  cardiologist. Her exam and evaluation are reassuring today. She has no symptoms to specifically suggest an alternative diagnosis such as GERD, or anxiety. However her symptoms do not specifically suggest ACS or PE. She is on hormonal birth control, however there is no pleuritic chest pain, no shortness breath, no chest pains are somewhat similar to what she's had the past. I do not suspect pulmonary embolism. I did discuss return precautions with respect to this diagnosis.  Patient / Family / Caregiver informed of clinical course, medical decision-making process, and agree with plan.   I discussed return precautions, follow-up instructions, and discharged instructions with patient and/or family.  ___________________________________________   FINAL CLINICAL IMPRESSION(S) / ED DIAGNOSES   Final diagnoses:  Chest pain, unspecified chest pain type    FOLLOW UP  Referred to: Dr. Deliah Boston, MD 04/12/15 1242  Governor Rooks, MD 04/12/15 1309

## 2015-04-12 NOTE — ED Notes (Signed)
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 Denies sob or nausea. Felt light headed when it started.

## 2015-04-12 NOTE — ED Notes (Signed)
Patient back from x-ray 

## 2015-04-12 NOTE — ED Notes (Signed)
Patient not in room at this time.

## 2015-04-17 ENCOUNTER — Ambulatory Visit (INDEPENDENT_AMBULATORY_CARE_PROVIDER_SITE_OTHER): Payer: 59 | Admitting: Cardiovascular Disease

## 2015-04-17 ENCOUNTER — Encounter: Payer: Self-pay | Admitting: Cardiovascular Disease

## 2015-04-17 VITALS — BP 110/78 | HR 82 | Ht 65.0 in | Wt 164.0 lb

## 2015-04-17 DIAGNOSIS — F17200 Nicotine dependence, unspecified, uncomplicated: Secondary | ICD-10-CM

## 2015-04-17 DIAGNOSIS — R0789 Other chest pain: Secondary | ICD-10-CM | POA: Diagnosis not present

## 2015-04-17 DIAGNOSIS — R079 Chest pain, unspecified: Secondary | ICD-10-CM | POA: Diagnosis not present

## 2015-04-17 DIAGNOSIS — Z720 Tobacco use: Secondary | ICD-10-CM

## 2015-04-17 DIAGNOSIS — R000 Tachycardia, unspecified: Secondary | ICD-10-CM

## 2015-04-17 DIAGNOSIS — I471 Supraventricular tachycardia: Secondary | ICD-10-CM | POA: Diagnosis not present

## 2015-04-17 MED ORDER — NITROGLYCERIN 0.4 MG SL SUBL: 0.4000 mg | SUBLINGUAL_TABLET | SUBLINGUAL | Status: DC | PRN

## 2015-04-17 NOTE — Progress Notes (Signed)
Patient ID: Linda BullaJamie D [name], female    DOB: 07/07/1986, 29 y.o.   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 On this regimen, she feels relatively asymptomatic.   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 Tachycardia was first, followed by chest pain 1 minute later. Symptoms were significant, she went to the emergency room for further evaluation. Tachycardia resolved on its own, cardiac enzymes and EKG normal. No further episodes since that time  EKG on today's visit shows normal sinus rhythm with rate 82 bpm, no significant ST or T-wave changes  Other past medical history brother has history of WPW and has had ablations in the past.   Allergies  Allergen Reactions  . Penicillins     Current Outpatient Prescriptions on File Prior to Visit  Medication Sig Dispense Refill  . albuterol (PROVENTIL) (2.5 MG/3ML) 0.083% nebulizer solution Take 2.5 mg by nebulization every 6 (six) hours as needed for wheezing.    . metoprolol succinate (TOPROL XL) 25 MG 24 hr tablet Take 1 tablet (25 mg total) by mouth 2 (two) times daily. 180 tablet 3  . norgestimate-ethinyl estradiol (ORTHO-CYCLEN,SPRINTEC,PREVIFEM) 0.25-35 MG-MCG tablet Take 1 tablet by mouth daily.    . propranolol (INDERAL) 10 MG tablet Take 1 tablet (10 mg total) by mouth 3 (three) times daily as needed. 90 tablet 1   No current facility-administered medications on file prior to visit.     Past Medical History  Diagnosis Date  . Tachycardia     Past Surgical History  Procedure Laterality Date  . Knee arthroscopy      x2    Social History  reports that she has been smoking.  She does not have any smokeless tobacco history on file. She reports that she drinks alcohol. She reports that she does not use illicit drugs.  Family History family history includes Heart attack in her father; Heart disease in her father; Hyperlipidemia in her father and mother; Hypertension in her father.  Review of Systems  Constitutional: Negative.   Respiratory: Negative.   Cardiovascular: Negative.   Gastrointestinal: Negative.   Musculoskeletal: Negative.   Skin: Negative.   Neurological: Negative.   Hematological: Negative.   Psychiatric/Behavioral: Negative.   All other systems reviewed and are negative.   BP 110/78 mmHg  Pulse 82  Ht 5\' 5"  (1.651 m)  Wt 164 lb (74.39 kg)  BMI 27.29 kg/m2  LMP 04/10/2015   Physical Exam  Constitutional: She is oriented to person, place, and time. She appears well-developed and well-nourished.  HENT:  Head: Normocephalic.  Nose: Nose normal.  Mouth/Throat: Oropharynx is clear and moist.  Eyes: Conjunctivae are normal. Pupils are equal, round, and reactive to light.  Neck: Normal range of motion. Neck supple. No JVD present.  Cardiovascular: Regular rhythm, S1 normal, S2 normal, normal heart sounds and intact distal pulses.  Tachycardia present.  Exam reveals no gallop and no friction rub.   No murmur heard. Pulmonary/Chest:  Effort normal and breath sounds normal. No respiratory distress. She has no wheezes. She has no rales. She exhibits no tenderness.  Abdominal: Soft. Bowel sounds are normal. She exhibits no distension. There is no tenderness.  Musculoskeletal: Normal range of motion. She exhibits no edema or tenderness.  Lymphadenopathy:    She has no cervical adenopathy.  Neurological: She is alert and oriented to person, place,  and time. Coordination normal.  Skin: Skin is warm and dry. No rash noted. No erythema.  Psychiatric: She has a normal mood and affect. Her behavior is normal. Judgment and thought content normal.    Assessment and Plan  Nursing note and vitals reviewed.

## 2015-04-17 NOTE — Assessment & Plan Note (Signed)
Recent episode of tachycardia with rate up to 140 bpm, resolved on its own. Possible atrial tachycardia. Symptoms are rare. Recommended a 30 day monitor to identify the rhythm. She is concerned about WPW

## 2015-04-17 NOTE — Assessment & Plan Note (Signed)
We have encouraged her to continue to work on weaning her cigarettes and smoking cessation. She will continue to work on this and does not want any assistance with chantix.  

## 2015-04-17 NOTE — Patient Instructions (Addendum)
No medication changes were made.  We will order a 30 day monitor for tachycardia  Please call us if you have new issues that need to be addressed before your next appt.      Cardiac Event Monitoring A cardiac event monitor is a small recording device used to help detect abnormal heart rhythms (arrhythmias). The monitor is used to record heart rhythm when noticeable symptoms such as the following occur:  Fast heartbeats (palpitations), such as heart racing or fluttering.  Dizziness.  Fainting or light-headedness.  Unexplained weakness. The monitor is wired to two electrodes placed on your chest. Electrodes are flat, sticky disks that attach to your skin. The monitor can be worn for up to 30 days. You will wear the monitor at all times, except when bathing.  HOW TO USE YOUR CARDIAC EVENT MONITOR A technician will prepare your chest for the electrode placement. The technician will show you how to place the electrodes, how to work the monitor, and how to replace the batteries. Take time to practice using the monitor before you leave the office. Make sure you understand how to send the information from the monitor to your health care provider. This requires a telephone with a landline, not a cell phone. You need to:  Wear your monitor at all times, except when you are in water:  Do not get the monitor wet.  Take the monitor off when bathing. Do not swim or use a hot tub with it on.  Keep your skin clean. Do not put body lotion or moisturizer on your chest.  Change the electrodes daily or any time they stop sticking to your skin. You might need to use tape to keep them on.  It is possible that your skin under the electrodes could become irritated. To keep this from happening, try to put the electrodes in slightly different places on your chest. However, they must remain in the area under your left breast and in the upper right section of your chest.  Make sure the monitor is safely  clipped to your clothing or in a location close to your body that your health care provider recommends.  Press the button to record when you feel symptoms of heart trouble, such as dizziness, weakness, light-headedness, palpitations, thumping, shortness of breath, unexplained weakness, or a fluttering or racing heart. The monitor is always on and records what happened slightly before you pressed the button, so do not worry about being too late to get good information.  Keep a diary of your activities, such as walking, doing chores, and taking medicine. It is especially important to note what you were doing when you pushed the button to record your symptoms. This will help your health care provider determine what might be contributing to your symptoms. The information stored in your monitor will be reviewed by your health care provider alongside your diary entries.  Send the recorded information as recommended by your health care provider. It is important to understand that it will take some time for your health care provider to process the results.  Change the batteries as recommended by your health care provider. SEEK IMMEDIATE MEDICAL CARE IF:   You have chest pain.  You have extreme difficulty breathing or shortness of breath.  You develop a very fast heartbeat that persists.  You develop dizziness that does not go away.  You faint or constantly feel you are about to faint. Document Released: 06/30/2008 Document Revised: 02/05/2014 Document Reviewed: 03/20/2013 Exit[employer] Patient Information  2015 [employer]. This information is not intended to replace advice given to you by your health care provider. Make sure you discuss any questions you have with your health care provider.  

## 2015-04-17 NOTE — Assessment & Plan Note (Signed)
Significant chest pain in the setting of her tachycardia. Unable to exclude spasm. Typically tachycardia presents alone with no chest pain symptoms. We have offered nitroglycerin for possible spasm and suggested she take one half tab in a supine position for any recurrent chest pain symptoms. Low risk of ischemia. Workup in the ER reviewed with her, cardiac enzymes negative, normal EKG, no symptoms since that time concerning for ischemia. No further testing at this time

## 2015-04-19 ENCOUNTER — Encounter (INDEPENDENT_AMBULATORY_CARE_PROVIDER_SITE_OTHER): Payer: 59

## 2015-04-19 DIAGNOSIS — R0789 Other chest pain: Secondary | ICD-10-CM | POA: Diagnosis not present

## 2015-05-21 ENCOUNTER — Other Ambulatory Visit: Payer: Self-pay | Admitting: *Deleted

## 2015-09-17 ENCOUNTER — Ambulatory Visit: Payer: Self-pay | Admitting: Physician Assistant

## 2015-09-17 ENCOUNTER — Encounter: Payer: Self-pay | Admitting: Physician Assistant

## 2015-09-17 VITALS — BP 120/80 | HR 76 | Temp 98.5°F

## 2015-09-17 DIAGNOSIS — J069 Acute upper respiratory infection, unspecified: Secondary | ICD-10-CM

## 2015-09-17 MED ORDER — BENZONATATE 200 MG PO CAPS: 200.0000 mg | ORAL_CAPSULE | Freq: Three times a day (TID) | ORAL | Status: DC | PRN

## 2015-09-17 MED ORDER — AZITHROMYCIN 250 MG PO TABS: ORAL_TABLET | ORAL | Status: DC

## 2015-09-17 NOTE — Progress Notes (Signed)
S: C/o runny nose and congestion for 3 days, no fever, chills, cp/sob, v/d; mucus was green this am but clear throughout the day, cough is sporadic, keeping her awake at night, mother has been in  Hospital and she has been there a lot, 1/2 ppd smoker  Using otc meds: robitussin  O: PE: vitals wnl, nad, perrl eomi, normocephalic, tms dull, nasal mucosa red and swollen, throat injected, neck supple no lymph, lungs c t a, cv rrr, neuro intact  A:  Acute uri   P: zpack, tessalon perls, drink fluids, continue regular meds , use otc meds of choice, return if not improving in 5 days, return earlier if worsening

## 2015-10-25 ENCOUNTER — Encounter: Payer: Self-pay | Admitting: Nurse Practitioner

## 2015-10-25 ENCOUNTER — Ambulatory Visit (INDEPENDENT_AMBULATORY_CARE_PROVIDER_SITE_OTHER): Payer: 59 | Admitting: Nurse Practitioner

## 2015-10-25 VITALS — BP 110/82 | HR 98 | Ht 65.0 in | Wt 172.0 lb

## 2015-10-25 DIAGNOSIS — Z720 Tobacco use: Secondary | ICD-10-CM

## 2015-10-25 DIAGNOSIS — R000 Tachycardia, unspecified: Secondary | ICD-10-CM

## 2015-10-25 NOTE — Progress Notes (Signed)
Office Visit    Patient Name: Linda [name] Date of Encounter: 10/25/2015  Primary Care Provider:  Delphia Grates Primary Cardiologist:  Concha Se, MD   Chief Complaint    30 year old female with a history of tachycardia who presents for follow-up.  Past Medical History    Past Medical History  Diagnosis Date  . Tachycardia     a. 05/2015 Event monitor: sinus rhythm, no significant arrhythmias. Pt activated strips do not correlate to any significant arrhythmia.  . Tobacco abuse    Past Surgical History  Procedure Laterality Date  . Knee arthroscopy      x2    Allergies  Allergies  Allergen Reactions  . Penicillins     History of Present Illness    30 year old female with a prior history of tachycardia, found to be sinus tachycardia by event monitoring in August 2016. She has a long history of elevated heart rates, stating that her heart rates are typically running 100-110 and that usually she was asymptomatic. She did experience tachycardia palpitations and chest pain in July 2016 with negative ER workup. That visit to the event monitor in August 2016, which showed sinus rhythm without any arrhythmias to correlate with patient activations.  She says that following the event monitor, she sought treatment for anxiety and has since been on Lexapro. Since then, she has had no recurrence of palpitations at all. Heart rates typically run in the 70s to 80s on Toprol-XL 50 mg daily.  She further denies any recent history of chest pain, palpitations, PND, orthopnea, dizziness, syncope, edema, or early satiety.  Home Medications    Prior to Admission medications   Medication Sig Start Date End Date Taking? Authorizing Provider  albuterol (PROVENTIL) (2.5 MG/3ML) 0.083% nebulizer solution Take 2.5 mg by nebulization every 6 (six) hours as needed for wheezing.   Yes Historical Provider, MD  escitalopram (LEXAPRO) 10 MG tablet Take 10 mg by mouth daily.  07/24/15  Yes  Historical Provider, MD  levonorgestrel-ethinyl estradiol (NORDETTE) 0.15-30 MG-MCG tablet Take 1 tablet by mouth daily.  07/30/15  Yes Historical Provider, MD  metoprolol succinate (TOPROL XL) 25 MG 24 hr tablet Take 1 tablet (25 mg total) by mouth 2 (two) times daily. 02/11/15  Yes Antonieta Iba, MD  nitroGLYCERIN (NITROSTAT) 0.4 MG SL tablet Place 1 tablet (0.4 mg total) under the tongue every 5 (five) minutes as needed for chest pain. 04/17/15  Yes Antonieta Iba, MD  propranolol (INDERAL) 10 MG tablet Take 1 tablet (10 mg total) by mouth 3 (three) times daily as needed. Patient taking differently: Take 10 mg by mouth 3 (three) times daily as needed (as needed for palpitations).  01/25/14  Yes Antonieta Iba, MD    Review of Systems    As above, she's been doing well since her last visit. She denies chest pain, palpitations, dyspnea, PND, orthopnea, dizziness, syncope, edema, or early satiety. She does continue to smoke.  All other systems reviewed and are otherwise negative except as noted above.  Physical Exam    VS:  BP 110/82 mmHg  Pulse 98  Ht  (1.651 m)  Wt 172 lb (78.019 kg)  BMI 28.62 kg/m2 , BMI Body mass index is 28.62 kg/(m^2). GEN: Well nourished, well developed, in no acute distress. HEENT: normal. Neck: Supple, no JVD, carotid bruits, or masses. Cardiac: RRR, no murmurs, rubs, or gallops. No clubbing, cyanosis, edema.  Radials/DP/PT 2+ and equal bilaterally.  Respiratory:  Respirations regular  and unlabored, clear to auscultation bilaterally. GI: Soft, nontender, nondistended, BS + x 4. MS: no deformity or atrophy. Skin: warm and dry, no rash. Neuro:  Strength and sensation are intact. Psych: Normal affect.  Accessory Clinical Findings    None  Assessment & Plan    1.  Tachycardia: Patient has a long history of sinus tachycardia with recent event monitor in August 2016 showing only sinus rhythm without any evidence for arrhythmia. Patient activations did  not reveal any significant arrhythmia. Patient has since been under treatment for anxiety with significant improvement in symptoms. She is currently taking Toprol-XL 59 g daily but would be interested in weaning this off, provided that her anxiety is adequately treated and was driving her symptoms. I advised that that would be okay that she can continue to take when necessary propranolol if she has recurrent tachycardia.  2. Tobacco abuse: Complete cessation advised. Patient says that she would like to quit but is not currently planning on quitting.  3. Disposition: Follow-up in one year or sooner if necessary.   Nicolasa Ducking, NP 10/25/2015, 4:04 PM

## 2015-10-25 NOTE — Patient Instructions (Signed)
Medication Instructions:  Your physician recommends that you continue on your current medications as directed. Please refer to the Current Medication list given to you today.   Labwork: none  Testing/Procedures: none  Follow-Up: Your physician wants you to follow-up in: one year with Dr. Gollan.  You will receive a reminder letter in the mail two months in advance. If you don't receive a letter, please call our office to schedule the follow-up appointment.   Any Other Special Instructions Will Be Listed Below (If Applicable).     If you need a refill on your cardiac medications before your next appointment, please call your pharmacy.   

## 2015-10-29 DIAGNOSIS — G43909 Migraine, unspecified, not intractable, without status migrainosus: Secondary | ICD-10-CM | POA: Diagnosis not present

## 2015-10-29 DIAGNOSIS — F411 Generalized anxiety disorder: Secondary | ICD-10-CM | POA: Diagnosis not present

## 2015-10-29 DIAGNOSIS — F17210 Nicotine dependence, cigarettes, uncomplicated: Secondary | ICD-10-CM | POA: Diagnosis not present

## 2016-03-18 DIAGNOSIS — F411 Generalized anxiety disorder: Secondary | ICD-10-CM | POA: Diagnosis not present

## 2016-03-18 DIAGNOSIS — G43909 Migraine, unspecified, not intractable, without status migrainosus: Secondary | ICD-10-CM | POA: Diagnosis not present

## 2016-05-19 ENCOUNTER — Ambulatory Visit: Payer: Self-pay | Admitting: Physician Assistant

## 2016-06-29 DIAGNOSIS — H5213 Myopia, bilateral: Secondary | ICD-10-CM | POA: Diagnosis not present

## 2016-06-29 DIAGNOSIS — H52223 Regular astigmatism, bilateral: Secondary | ICD-10-CM | POA: Diagnosis not present

## 2016-08-26 ENCOUNTER — Ambulatory Visit: Payer: Self-pay | Admitting: Physician Assistant

## 2016-11-27 DIAGNOSIS — Z8481 Family history of carrier of genetic disease: Secondary | ICD-10-CM | POA: Diagnosis not present

## 2016-11-27 DIAGNOSIS — Z01419 Encounter for gynecological examination (general) (routine) without abnormal findings: Secondary | ICD-10-CM | POA: Diagnosis not present

## 2017-06-29 DIAGNOSIS — H5213 Myopia, bilateral: Secondary | ICD-10-CM | POA: Diagnosis not present

## 2017-06-29 DIAGNOSIS — H52223 Regular astigmatism, bilateral: Secondary | ICD-10-CM | POA: Diagnosis not present

## 2017-11-11 ENCOUNTER — Ambulatory Visit (INDEPENDENT_AMBULATORY_CARE_PROVIDER_SITE_OTHER): Payer: Self-pay | Admitting: Family Medicine

## 2017-11-11 VITALS — BP 120/82 | HR 90 | Temp 98.2°F | Wt 195.0 lb

## 2017-11-11 DIAGNOSIS — R42 Dizziness and giddiness: Secondary | ICD-10-CM

## 2017-11-11 DIAGNOSIS — R110 Nausea: Secondary | ICD-10-CM | POA: Diagnosis not present

## 2017-11-11 DIAGNOSIS — H539 Unspecified visual disturbance: Secondary | ICD-10-CM

## 2017-11-11 DIAGNOSIS — R030 Elevated blood-pressure reading, without diagnosis of hypertension: Secondary | ICD-10-CM | POA: Diagnosis not present

## 2017-11-11 DIAGNOSIS — H8113 Benign paroxysmal vertigo, bilateral: Secondary | ICD-10-CM | POA: Diagnosis not present

## 2017-11-11 NOTE — Patient Instructions (Signed)
I highly recommend immediate follow-up at the ED for with your PCP for further evaluation and management of current symptoms.  I am concerned that you are having some neurological symptoms which include visual disturbances of the left accompanied by acute onset dizziness.  This definitely requires further diagnostic measures that are not available at our office. Your blood pressure is fine today so I do not believe is an issue of dehydration or hypotension.  As I am unaware of the underlying cause of your symptom I would not be able to treat you today.   Visual Disturbances A visual disturbance is any problem that interferes with your normal vision. You can have a visual disturbance in one eye or both eyes. Some types of visual disturbances come and go without treatment and do not cause a permanent problem. Other visual disturbances may be a sign of a serious medical emergency. There are many signs and symptoms of a visual disturbance, including:  Blurred vision.  Inability to see certain colors.  Seeing floating spots (floaters).  Light sensitivity.  Flashing or shimmering lights.  Zigzagging lines or stars.  Seeing the floor as tilted (visual midline shift).  Being unaware of objects on one side of the body (visual spatial inattention).  Double vision.  Rhythmic, involuntary eye movements (nystagmus).  Hallucinations.  Temporary or permanent blindness.  Follow these instructions at home:  Take over-the-counter and prescription medicines only as told by your health care provider.  To lower your risk of the problems that can lead to visual disturbances: ? Eat a healthy diet. ? Maintain a healthy weight. Lose weight if you need to. ? Exercise regularly. Ask your health care provider what activities are safe for you.  Avoid migraine triggers when possible.  Keep all follow-up visits as told by your health care provider. This is important. Contact a health care provider  if:  Your visual disturbance changes or becomes worse.  You notice any new visual disturbances. Get help right away if:  You lose most or all of your vision in one eye or both eyes.  You experience visual hallucinations.  You have chest pain, nausea, or vomiting along with visual disturbances. This information is not intended to replace advice given to you by your health care provider. Make sure you discuss any questions you have with your health care provider. Document Released: 10/29/2004 Document Revised: 03/04/2016 Document Reviewed: 02/28/2014 Elsevier Interactive Patient Education  2018 [employer].     Dizziness Dizziness is a common problem. It is a feeling of unsteadiness or light-headedness. You may feel like you are about to faint. Dizziness can lead to injury if you stumble or fall. Anyone can become dizzy, but dizziness is more common in older adults. This condition can be caused by a number of things, including medicines, dehydration, or illness. Follow these instructions at home: Eating and drinking  Drink enough fluid to keep your urine clear or pale yellow. This helps to keep you from becoming dehydrated. Try to drink more clear fluids, such as water.  Do not drink alcohol.  Limit your caffeine intake if told to do so by your health care provider. Check ingredients and nutrition facts to see if a food or beverage contains caffeine.  Limit your salt (sodium) intake if told to do so by your health care provider. Check ingredients and nutrition facts to see if a food or beverage contains sodium. Activity  Avoid making quick movements. ? Rise slowly from chairs and steady yourself until  you feel okay. ? In the morning, first sit up on the side of the bed. When you feel okay, stand slowly while you hold onto something until you know that your balance is fine.  If you need to stand in one place for a long time, move your legs often. Tighten and relax the muscles in  your legs while you are standing.  Do not drive or use heavy machinery if you feel dizzy.  Avoid bending down if you feel dizzy. Place items in your home so that they are easy for you to reach without leaning over. Lifestyle  Do not use any products that contain nicotine or tobacco, such as cigarettes and e-cigarettes. If you need help quitting, ask your health care provider.  Try to reduce your stress level by using methods such as yoga or meditation. Talk with your health care provider if you need help to manage your stress. General instructions  Watch your dizziness for any changes.  Take over-the-counter and prescription medicines only as told by your health care provider. Talk with your health care provider if you think that your dizziness is caused by a medicine that you are taking.  Tell a friend or a family member that you are feeling dizzy. If he or she notices any changes in your behavior, have this person call your health care provider.  Keep all follow-up visits as told by your health care provider. This is important. Contact a health care provider if:  Your dizziness does not go away.  Your dizziness or light-headedness gets worse.  You feel nauseous.  You have reduced hearing.  You have new symptoms.  You are unsteady on your feet or you feel like the room is spinning. Get help right away if:  You vomit or have diarrhea and are unable to eat or drink anything.  You have problems talking, walking, swallowing, or using your arms, hands, or legs.  You feel generally weak.  You are not thinking clearly or you have trouble forming sentences. It may take a friend or family member to notice this.  You have chest pain, abdominal pain, shortness of breath, or sweating.  Your vision changes.  You have any bleeding.  You have a severe headache.  You have neck pain or a stiff neck.  You have a fever. These symptoms may represent a serious problem that is an  emergency. Do not wait to see if the symptoms will go away. Get medical help right away. Call your local emergency services (911 in the [place] Do not drive yourself to the hospital. Summary  Dizziness is a feeling of unsteadiness or light-headedness. This condition can be caused by a number of things, including medicines, dehydration, or illness.  Anyone can become dizzy, but dizziness is more common in older adults.  Drink enough fluid to keep your urine clear or pale yellow. Do not drink alcohol.  Avoid making quick movements if you feel dizzy. Monitor your dizziness for any changes. This information is not intended to replace advice given to you by your health care provider. Make sure you discuss any questions you have with your health care provider. Document Released: 03/17/2001 Document Revised: 10/24/2016 Document Reviewed: 10/24/2016 Elsevier Interactive Patient Education  [employer]2018 [employer] Inc[employer]

## 2017-11-11 NOTE — Progress Notes (Signed)
Subjective:  Linda [name] is a 32 y.o. female , who is a smoker and history of migraine headaches, presents for evaluation of dizziness and three weeks,  left eye vision diminishment, and left ear pain. She reports over the last 3 weeks experiencing problems with her vision in left eye to the point she has been changing her contact lenses to try and correct her vision without improvement. These symptoms have never occurred previously. She is experiencing dizziness to the point of feeling as if she may fall at times. The dizziness is ongoing regardless of standing, sitting, or lying. Dizziness worsens with positional changes.   The following portions of the patient's history were reviewed and updated as appropriate:  allergies, current medications and past medical history.  Eyes: positive for visual disturbance Respiratory: negative Cardiovascular: negative Neurological: positive for dizziness and gait unsteadiness Objective:  BP 120/82   Pulse 90   Temp 98.2 F (36.8 C)   Wt 195 lb (88.5 kg)   SpO2 98%   BMI 32.45 kg/m   Physical Exam  Vitals reviewed. Constitutional: She is oriented to person, place, and time. She appears well-developed and well-nourished.  HENT:  Head: Normocephalic and atraumatic.  Eyes: Conjunctivae are normal. Pupils are equal, round, and reactive to light.  Left eye funduscopic exam abnormal appeared hemorrhagic compared to right  Neck: Normal range of motion. Neck supple.  Cardiovascular: Normal rate, regular rhythm, normal heart sounds and intact distal pulses.  Respiratory: Effort normal and breath sounds normal.  Musculoskeletal: Normal range of motion.  Lymphadenopathy:    She has no cervical adenopathy.  Neurological: She is alert and oriented to person, place, and time. She has normal strength. Coordination and gait abnormal. GCS eye subscore is 4. GCS verbal subscore is 5. GCS motor subscore is 6.  Could not perform Romberg patient unable to stand  safely without holding on to table.  Skin: Skin is warm and dry.  Psychiatric: She has a normal mood and affect. Her behavior is normal. Judgment and thought content normal.   Assessment:  Dizziness  Visual Disturbance  Plan:  Due to complexity of patient's abnormal neurovascular exam and associated vision changes in left eye, patient warrants further workup and evaluation to rule out any neurological dysfunction precipitating symptoms. Patient was advised to follow-up at the emergency department however became upset and prefer to contact PCP. I advised her not to delay further evaluation of symptoms as she is having some visual disturbances in the left eye. She verbalized understanding, and assured me that she would follow-up.    Godfrey PickKimberly S. Tiburcio PeaHarris, MSN, FNP-C [address]  [place], [place] [id number] [phone]

## 2018-03-11 ENCOUNTER — Ambulatory Visit (INDEPENDENT_AMBULATORY_CARE_PROVIDER_SITE_OTHER): Payer: Self-pay | Admitting: Nurse Practitioner

## 2018-03-11 VITALS — BP 115/80 | HR 115 | Temp 98.2°F | Resp 16

## 2018-03-11 DIAGNOSIS — J3089 Other allergic rhinitis: Secondary | ICD-10-CM

## 2018-03-11 DIAGNOSIS — R05 Cough: Secondary | ICD-10-CM

## 2018-03-11 DIAGNOSIS — R059 Cough, unspecified: Secondary | ICD-10-CM

## 2018-03-11 MED ORDER — MONTELUKAST SODIUM 10 MG PO TABS: 10.0000 mg | ORAL_TABLET | Freq: Every day | ORAL | 0 refills | Status: DC

## 2018-03-11 MED ORDER — GUAIFENESIN-DM 100-10 MG/5ML PO SYRP: 5.0000 mL | ORAL_SOLUTION | ORAL | 0 refills | Status: AC | PRN

## 2018-03-11 NOTE — Progress Notes (Signed)
Subjective:     Linda [name] is a 32 y.o. female here for evaluation of a cough.  The cough is non-productive, without wheezing, dyspnea or hemoptysis and is aggravated by nothing. Onset of symptoms was 3 days ago, gradually worsening since that time.  Associated symptoms include low grade fever, sore throat and chest/back pain with coughing. Patient states the cough is worse at night and with lying down, but is persistent throughout the day.  Patient does not have a history of asthma. Patient has not had recent travel. Patient does have a history of smoking, patient quit 2 years ago. Patient  has not had a previous chest x-ray. Patient has attempted to use [employer], states they helped the cough, but make her dizzy.  Patient has also been using cough drops. Patient states her cough is what brought her into the office today.   The following portions of the patient's history were reviewed and updated as appropriate: allergies, current medications and past medical history.  Review of Systems Constitutional: positive for fatigue, fevers and bodyaches, negative for anorexia, sweats and weight loss Eyes: negative Ears, nose, mouth, throat, and face: positive for sore throat, negative for ear drainage, earaches, nasal congestion and voice change Respiratory: positive for cough, negative for asthma, chronic bronchitis, pneumonia, sputum, stridor and wheezing Cardiovascular: negative Gastrointestinal: negative Neurological: positive for headaches, negative for coordination problems, dizziness, paresthesia, seizures, tremors, vertigo and weakness Allergic/Immunologic: negative     Objective:    not performedBP 115/80 (BP Location: Right Arm, Patient Position: Sitting, Cuff Size: Normal)   Pulse (!) 115   Temp 98.2 F (36.8 C) (Oral)   Resp 16   SpO2 97%  General appearance: alert, cooperative, fatigued and no distress Head: Normocephalic, without obvious abnormality, atraumatic Eyes:  conjunctivae/corneas clear. PERRL, EOM's intact. Fundi benign. Ears: normal TM's and external ear canals both ears Nose: Nares normal. Septum midline. Mucosa normal. No drainage or sinus tenderness. Throat: abnormal findings: mild oropharyngeal erythema Lungs: clear to auscultation bilaterally Heart: regular rate and rhythm, S1, S2 normal, no murmur, click, rub or gallop Abdomen: soft, non-tender; bowel sounds normal; no masses,  no organomegaly Pulses: 2+ and symmetric Skin: Skin color, texture, turgor normal. No rashes or lesions Lymph nodes: cervical and submandibular nodes normal Neurologic: Grossly normal      Assessment:    Cough and Allergic Rhinitis    Plan:    Explained lack of efficacy of antibiotics in viral disease. Antitussives per medication orders. Avoid exposure to tobacco smoke and fumes. B-agonist inhaler. Call if shortness of breath worsens, blood in sputum, change in character of cough, development of fever or chills, inability to maintain nutrition and hydration. Avoid exposure to tobacco smoke and fumes. Patient also given prescription for Singulair 1 tablet at bedtime.  Patient instructed to increase fluids.  DIscussed with patient to attempt OTC medications due to her underlying condition of tachycardia.  Patient instructed to follow up if her symptoms do not improve in the next 5-7 days.  Discussed with patient that an antibitoic was not warranted at this time, but to return if her symptoms do not improve.  The patient was instructed to increase fluids and to use cough medicine up to 4 times daily.  Patient education provided.  Patient verbalizes understanding and has no questions at time of discharge.    Meds ordered this encounter  Medications  . guaiFENesin-dextromethorphan (ROBITUSSIN DM) 100-10 MG/5ML syrup    Sig: Take 5 mLs by mouth every 4 (  four) hours as needed for up to 7 days for cough.    Dispense:  150 mL    Refill:  0    Order Specific Question:    Supervising Provider    Answer:   Stacie Glaze [[id number]]  . montelukast (SINGULAIR) 10 MG tablet    Sig: Take 1 tablet (10 mg total) by mouth at bedtime.    Dispense:  30 tablet    Refill:  0    Order Specific Question:   Supervising Provider    Answer:   Stacie Glaze [phone]

## 2018-03-11 NOTE — Patient Instructions (Signed)
Allergic Rhinitis, Adult Allergic rhinitis is an allergic reaction that affects the mucous membrane inside the nose. It causes sneezing, a runny or stuffy nose, and the feeling of mucus going down the back of the throat (postnasal drip). Allergic rhinitis can be mild to severe. There are two types of allergic rhinitis:  Seasonal. This type is also called hay fever. It happens only during certain seasons.  Perennial. This type can happen at any time of the year.  What are the causes? This condition happens when the body's defense system (immune system) responds to certain harmless substances called allergens as though they were germs.  Seasonal allergic rhinitis is triggered by pollen, which can come from grasses, trees, and weeds. Perennial allergic rhinitis may be caused by:  House dust mites.  Pet dander.  Mold spores.  What are the signs or symptoms? Symptoms of this condition include:  Sneezing.  Runny or stuffy nose (nasal congestion).  Postnasal drip.  Itchy nose.  Tearing of the eyes.  Trouble sleeping.  Daytime sleepiness.  How is this diagnosed? This condition may be diagnosed based on:  Your medical history.  A physical exam.  Tests to check for related conditions, such as: ? Asthma. ? Pink eye. ? Ear infection. ? Upper respiratory infection.  Tests to find out which allergens trigger your symptoms. These may include skin or blood tests.  How is this treated? There is no cure for this condition, but treatment can help control symptoms. Treatment may include:  Taking medicines that block allergy symptoms, such as antihistamines. Medicine may be given as a shot, nasal spray, or pill.  Avoiding the allergen.  Desensitization. This treatment involves getting ongoing shots until your body becomes less sensitive to the allergen. This treatment may be done if other treatments do not help.  If taking medicine and avoiding the allergen does not work, new,  stronger medicines may be prescribed.  Follow these instructions at home:  Find out what you are allergic to. Common allergens include smoke, dust, and pollen.  Avoid the things you are allergic to. These are some things you can do to help avoid allergens: ? Replace carpet with wood, tile, or vinyl flooring. Carpet can trap dander and dust. ? Do not smoke. Do not allow smoking in your home. ? Change your heating and air conditioning filter at least once a month. ? During allergy season:  Keep windows closed as much as possible.  Plan outdoor activities when pollen counts are lowest. This is usually during the evening hours.  When coming indoors, change clothing and shower before sitting on furniture or bedding.  Take over-the-counter and prescription medicines only as told by your health care provider.  Keep all follow-up visits as told by your health care provider. This is important. Contact a health care provider if:  You have a fever.  You develop a persistent cough.  You make whistling sounds when you breathe (you wheeze).  Your symptoms interfere with your normal daily activities. Get help right away if:  You have shortness of breath. Summary  This condition can be managed by taking medicines as directed and avoiding allergens.  Contact your health care provider if you develop a persistent cough or fever.  During allergy season, keep windows closed as much as possible. This information is not intended to replace advice given to you by your health care provider. Make sure you discuss any questions you have with your health care provider. Document Released: 06/16/2001 Document Revised: 10/29/2016  Document Reviewed: 10/29/2016 Elsevier Interactive Patient Education  2018 Else[employer] Inc[employer]  Cough, Adult Coughing is a reflex that clears your throat and your airways. Coughing helps to heal and protect your lungs. It is normal to cough occasionally, but a cough that happens  with other symptoms or lasts a long time may be a sign of a condition that needs treatment. A cough may last only 2-3 weeks (acute), or it may last longer than 8 weeks (chronic). What are the causes? Coughing is commonly caused by:  Breathing in substances that irritate your lungs.  A viral or bacterial respiratory infection.  Allergies.  Asthma.  Postnasal drip.  Smoking.  Acid backing up from the stomach into the esophagus (gastroesophageal reflux).  Certain medicines.  Chronic lung problems, including COPD (or rarely, lung cancer).  Other medical conditions such as heart failure.  Follow these instructions at home: Pay attention to any changes in your symptoms. Take these actions to help with your discomfort:  Take medicines only as told by your health care provider. ? If you were prescribed an antibiotic medicine, take it as told by your health care provider. Do not stop taking the antibiotic even if you start to feel better. ? Talk with your health care provider before you take a cough suppressant medicine.  Drink enough fluid to keep your urine clear or pale yellow.  If the air is dry, use a cold steam vaporizer or humidifier in your bedroom or your home to help loosen secretions.  Avoid anything that causes you to cough at work or at home.  If your cough is worse at night, try sleeping in a semi-upright position.  Avoid cigarette smoke. If you smoke, quit smoking. If you need help quitting, ask your health care provider.  Avoid caffeine.  Avoid alcohol.  Rest as needed.  Contact a health care provider if:  You have new symptoms.  You cough up pus.  Your cough does not get better after 2-3 weeks, or your cough gets worse.  You cannot control your cough with suppressant medicines and you are losing sleep.  You develop pain that is getting worse or pain that is not controlled with pain medicines.  You have a fever.  You have unexplained weight  loss.  You have night sweats. Get help right away if:  You cough up blood.  You have difficulty breathing.  Your heartbeat is very fast. This information is not intended to replace advice given to you by your health care provider. Make sure you discuss any questions you have with your health care provider. Document Released: 03/20/2011 Document Revised: 02/27/2016 Document Reviewed: 11/28/2014 Elsevier Interactive Patient Education  [employer]2018 [employer]

## 2018-03-14 DIAGNOSIS — G43909 Migraine, unspecified, not intractable, without status migrainosus: Secondary | ICD-10-CM | POA: Diagnosis not present

## 2018-03-14 DIAGNOSIS — Z136 Encounter for screening for cardiovascular disorders: Secondary | ICD-10-CM | POA: Diagnosis not present

## 2018-03-14 DIAGNOSIS — J019 Acute sinusitis, unspecified: Secondary | ICD-10-CM | POA: Diagnosis not present

## 2018-03-14 DIAGNOSIS — Z0000 Encounter for general adult medical examination without abnormal findings: Secondary | ICD-10-CM | POA: Diagnosis not present

## 2018-03-15 ENCOUNTER — Telehealth: Payer: Self-pay | Admitting: Emergency Medicine

## 2018-03-15 NOTE — Telephone Encounter (Signed)
Spoke with patient stated that she is doing much better.

## 2018-04-25 DIAGNOSIS — H5213 Myopia, bilateral: Secondary | ICD-10-CM | POA: Diagnosis not present

## 2018-06-09 DIAGNOSIS — F988 Other specified behavioral and emotional disorders with onset usually occurring in childhood and adolescence: Secondary | ICD-10-CM | POA: Diagnosis not present

## 2018-10-07 ENCOUNTER — Encounter: Payer: Self-pay | Admitting: Physician Assistant

## 2018-10-07 ENCOUNTER — Ambulatory Visit (INDEPENDENT_AMBULATORY_CARE_PROVIDER_SITE_OTHER): Payer: Self-pay | Admitting: Physician Assistant

## 2018-10-07 VITALS — BP 138/80 | HR 111 | Temp 98.3°F | Resp 16 | Ht 65.0 in | Wt 204.0 lb

## 2018-10-07 DIAGNOSIS — J209 Acute bronchitis, unspecified: Secondary | ICD-10-CM

## 2018-10-07 MED ORDER — ALBUTEROL SULFATE 108 (90 BASE) MCG/ACT IN AEPB: 2.0000 | INHALATION_SPRAY | RESPIRATORY_TRACT | 0 refills | Status: DC | PRN

## 2018-10-07 MED ORDER — GUAIFENESIN-CODEINE 100-10 MG/5ML PO SYRP: 5.0000 mL | ORAL_SOLUTION | Freq: Three times a day (TID) | ORAL | 0 refills | Status: DC | PRN

## 2018-10-07 MED ORDER — BENZONATATE 100 MG PO CAPS: 100.0000 mg | ORAL_CAPSULE | Freq: Three times a day (TID) | ORAL | 0 refills | Status: DC | PRN

## 2018-10-07 NOTE — Progress Notes (Signed)
MRN: [id number] DOB: 03/20/1986  Subjective:   Linda [name] is a 33 y.o. female presenting for chief complaint of 6 day history of dry cough. It is described as hacking. Worse at night. Will sometimes feel wheezing. Developed nasal congestion, left ear fullness, and sinus pressure a couple days ago. Has been using mucinx-d consistently. Typically needs cough syrup with codeine when sx like this occur. Denies chest pain, SOB, sputum production, sinus pain, sore throat, voice change and myalgia, nausea, vomiting, abdominal pain and diarrhea. No known sick contact exposure. Denies recent travel. No PMH of allergies or asthma. Patient has had flu shot this season. Former smoker, quit 3 years ago. Currently on menstrual cycle. Denies any other aggravating or relieving factors, no other questions or concerns.  Review of Systems  Constitutional: Negative for diaphoresis.  Respiratory: Negative for hemoptysis and sputum production.   Cardiovascular: Negative for leg swelling.  Gastrointestinal: Negative for abdominal pain.  Neurological: Negative for dizziness and headaches.    Linda [name] has a current medication list which includes the following prescription(s): amphetamine-dextroamphetamine and levonorgestrel-ethinyl estradiol. Also is allergic to penicillins.  Linda [name]  has a past medical history of Tachycardia and Tobacco abuse. Also  has a past surgical history that includes Knee arthroscopy.   Objective:   Vitals: BP 138/80 (BP Location: Right Arm, Patient Position: Sitting, Cuff Size: Normal)   Pulse (!) 111   Temp 98.3 F (36.8 C)   Resp 16   Ht 5\' 5"  (1.651 m) Comment: Height verbally given  Wt 204 lb (92.5 kg) Comment: Verbal weight given  SpO2 98%   BMI 33.95 kg/m   Physical Exam Vitals signs reviewed.  Constitutional:      General: She is not in acute distress.    Appearance: She is well-developed. She is not ill-appearing.  HENT:     Head: Normocephalic and atraumatic.     Right  Ear: Ear canal and external ear normal. No tenderness. No middle ear effusion. Tympanic membrane is scarred and retracted. Tympanic membrane is not perforated, erythematous or bulging.     Left Ear: Ear canal and external ear normal. No tenderness.  No middle ear effusion. Tympanic membrane is scarred and retracted. Tympanic membrane is not perforated, erythematous or bulging.     Nose: Mucosal edema and congestion present.     Right Sinus: No maxillary sinus tenderness or frontal sinus tenderness.     Left Sinus: No maxillary sinus tenderness or frontal sinus tenderness.     Mouth/Throat:     Lips: Pink.     Mouth: Mucous membranes are moist.     Pharynx: Oropharynx is clear. Uvula midline. No posterior oropharyngeal erythema.     Tonsils: No tonsillar exudate or tonsillar abscesses.  Eyes:     Conjunctiva/sclera: Conjunctivae normal.  Neck:     Musculoskeletal: Normal range of motion.  Cardiovascular:     Rate and Rhythm: Normal rate and regular rhythm.     Heart sounds: Normal heart sounds.  Pulmonary:     Effort: Pulmonary effort is normal.     Breath sounds: Normal breath sounds. No decreased breath sounds, wheezing, rhonchi or rales.  Lymphadenopathy:     Head:     Right side of head: No submental, submandibular, tonsillar, preauricular, posterior auricular or occipital adenopathy.     Left side of head: No submental, submandibular, tonsillar, preauricular, posterior auricular or occipital adenopathy.     Cervical: No cervical adenopathy.     Upper Body:  Right upper body: No supraclavicular adenopathy.     Left upper body: No supraclavicular adenopathy.  Skin:    General: Skin is warm and dry.  Neurological:     Mental Status: She is alert.     No results found for this or any previous visit (from the past 24 hour(s)).  Assessment and Plan :  1. Acute bronchitis, unspecified organism Pt overall well appearing, NAD. VSS. Lungs CTAB. Hx and PE findings consistent with  viral bronchitis. Rec symptomatic/supportive care at this time. Exam findings, diagnosis etiology, medication use, indications, side effects, risks, benefits, and alternatives of the medications and treatment plan prescribed today and reviewed with patient. Follow- Up and discharge instructions provided. No emergent/urgent issues found on exam. Patient education was provided. Patient verbalized understanding of information provided and agrees with plan of care (POC), all questions answered. No barriers to understanding were identified. Red flags discussed in detail. The patient is advised to call or return to clinic if condition does not see an improvement in symptoms, would consider Rx for hycodan to use at night time if cheratussin does not provide relief (as she has had relief with hycodan in the past). Advised her to seek the care of family doctor, [place], local urgent care/ED if  condition worsens/develops new concerning sx with the above plan.  - benzonatate (TESSALON) 100 MG capsule; Take 1-2 capsules (100-200 mg total) by mouth 3 (three) times daily as needed for cough.  Dispense: 40 capsule; Refill: 0 - guaiFENesin-codeine (CHERATUSSIN AC) 100-10 MG/5ML syrup; Take 5 mLs by mouth 3 (three) times daily as needed for cough.  Dispense: 120 mL; Refill: 0 - Albuterol Sulfate (PROAIR RESPICLICK) 108 (90 Base) MCG/ACT AEPB; Inhale 2 puffs into the lungs every 4 (four) hours as needed.  Dispense: 1 each; Refill: 0    Benjiman Core, Cordelia Poche  [facility] 10/07/2018 1:56 PM

## 2018-10-07 NOTE — Patient Instructions (Addendum)
Acute Bronchitis, Adult  Start tesslon perles for cough.  Use cough syrup at night, will make you drowsy. May use inhaler as needed if you have wheezing, this can increase your heart rate, so use only if needed. Use nasal saline for nasal congestion.  Contact office if no improvement with cough syrup. Seek care if symptoms worsen/develop new concerning symptoms.    Acute bronchitis is when air tubes (bronchi) in the lungs suddenly get swollen. The condition can make it hard to breathe. It can also cause these symptoms:  A cough.  Coughing up clear, yellow, or green mucus.  Wheezing.  Chest congestion.  Shortness of breath.  A fever.  Body aches.  Chills.  A sore throat. Follow these instructions at home:  Medicines  Take over-the-counter and prescription medicines only as told by your doctor.  If you were prescribed an antibiotic medicine, take it as told by your doctor. Do not stop taking the antibiotic even if you start to feel better. General instructions  Rest.  Drink enough fluids to keep your pee (urine) pale yellow.  Avoid smoking and secondhand smoke. If you smoke and you need help quitting, ask your doctor. Quitting will help your lungs heal faster.  Use an inhaler, cool mist vaporizer, or humidifier as told by your doctor.  Keep all follow-up visits as told by your doctor. This is important. How is this prevented? To lower your risk of getting this condition again:  Wash your hands often with soap and water. If you cannot use soap and water, use hand sanitizer.  Avoid contact with people who have cold symptoms.  Try not to touch your hands to your mouth, nose, or eyes.  Make sure to get the flu shot every year. Contact a doctor if:  Your symptoms do not get better in 2 weeks. Get help right away if:  You cough up blood.  You have chest pain.  You have very bad shortness of breath.  You become dehydrated.  You faint (pass out) or keep  feeling like you are going to pass out.  You keep throwing up (vomiting).  You have a very bad headache.  Your fever or chills gets worse. This information is not intended to replace advice given to you by your health care provider. Make sure you discuss any questions you have with your health care provider. Document Released: 03/09/2008 Document Revised: 05/05/2017 Document Reviewed: 03/11/2016 Elsevier Interactive Patient Education  2019 [employer].

## 2018-10-10 ENCOUNTER — Telehealth: Payer: Self-pay | Admitting: Emergency Medicine

## 2018-10-10 NOTE — Telephone Encounter (Signed)
Spoke to patient whom stated that she is doing much better. Follow up call from visit with [facility]

## 2018-10-28 DIAGNOSIS — N938 Other specified abnormal uterine and vaginal bleeding: Secondary | ICD-10-CM | POA: Diagnosis not present

## 2018-11-04 DIAGNOSIS — R935 Abnormal findings on diagnostic imaging of other abdominal regions, including retroperitoneum: Secondary | ICD-10-CM | POA: Diagnosis not present

## 2018-11-04 DIAGNOSIS — N938 Other specified abnormal uterine and vaginal bleeding: Secondary | ICD-10-CM | POA: Diagnosis not present

## 2018-12-12 DIAGNOSIS — H5213 Myopia, bilateral: Secondary | ICD-10-CM | POA: Diagnosis not present

## 2018-12-12 DIAGNOSIS — H52223 Regular astigmatism, bilateral: Secondary | ICD-10-CM | POA: Diagnosis not present

## 2019-03-25 ENCOUNTER — Emergency Department (HOSPITAL_COMMUNITY): Payer: 59

## 2019-03-25 ENCOUNTER — Other Ambulatory Visit: Payer: Self-pay

## 2019-03-25 ENCOUNTER — Encounter (HOSPITAL_COMMUNITY): Payer: Self-pay | Admitting: Emergency Medicine

## 2019-03-25 ENCOUNTER — Emergency Department (HOSPITAL_COMMUNITY)
Admission: EM | Admit: 2019-03-25 | Discharge: 2019-03-25 | Disposition: A | Discharge status: Home / Self Care | Payer: 59 | Attending: Emergency Medicine | Admitting: Emergency Medicine

## 2019-03-25 DIAGNOSIS — R197 Diarrhea, unspecified: Secondary | ICD-10-CM | POA: Diagnosis not present

## 2019-03-25 DIAGNOSIS — R109 Unspecified abdominal pain: Secondary | ICD-10-CM | POA: Diagnosis not present

## 2019-03-25 DIAGNOSIS — Z87891 Personal history of nicotine dependence: Secondary | ICD-10-CM | POA: Diagnosis not present

## 2019-03-25 DIAGNOSIS — R509 Fever, unspecified: Secondary | ICD-10-CM | POA: Diagnosis not present

## 2019-03-25 DIAGNOSIS — A09 Infectious gastroenteritis and colitis, unspecified: Secondary | ICD-10-CM

## 2019-03-25 DIAGNOSIS — Z79899 Other long term (current) drug therapy: Secondary | ICD-10-CM | POA: Insufficient documentation

## 2019-03-25 LAB — COMPREHENSIVE METABOLIC PANEL
ALT: 24 U/L (ref 0–44)
AST: 27 U/L (ref 15–41)
Albumin: 3.5 g/dL (ref 3.5–5.0)
Alkaline Phosphatase: 70 U/L (ref 38–126)
Anion gap: 9 (ref 5–15)
BUN: 9 mg/dL (ref 6–20)
CO2: 25 mmol/L (ref 22–32)
Calcium: 8.9 mg/dL (ref 8.9–10.3)
Chloride: 100 mmol/L (ref 98–111)
Creatinine, Ser: 0.65 mg/dL (ref 0.44–1.00)
GFR calc Af Amer: >60 mL/min (ref >60)
GFR calc non Af Amer: >60 mL/min (ref >60)
Glucose, Bld: 90 mg/dL (ref 70–99)
Potassium: 3.6 mmol/L (ref 3.5–5.1)
Sodium: 134 mmol/L — ABNORMAL LOW (ref 135–145)
Total Bilirubin: 0.5 mg/dL (ref 0.3–1.2)
Total Protein: 7.5 g/dL (ref 6.5–8.1)

## 2019-03-25 LAB — CBC WITH DIFFERENTIAL/PLATELET
Abs Immature Granulocytes: 0.02 10*3/uL (ref 0.00–0.07)
Basophils Absolute: 0 10*3/uL (ref 0.0–0.1)
Basophils Relative: 0 %
Eosinophils Absolute: 0 10*3/uL (ref 0.0–0.5)
Eosinophils Relative: 1 %
HCT: 45.2 % (ref 36.0–46.0)
Hemoglobin: 14.3 g/dL (ref 12.0–15.0)
Immature Granulocytes: 1 %
Lymphocytes Relative: 25 %
Lymphs Abs: 0.9 10*3/uL (ref 0.7–4.0)
MCH: 26.3 pg (ref 26.0–34.0)
MCHC: 31.6 g/dL (ref 30.0–36.0)
MCV: 83.1 fL (ref 80.0–100.0)
Monocytes Absolute: 0.4 10*3/uL (ref 0.1–1.0)
Monocytes Relative: 10 %
Neutro Abs: 2.2 10*3/uL (ref 1.7–7.7)
Neutrophils Relative %: 63 %
Platelets: 243 10*3/uL (ref 150–400)
RBC: 5.44 MIL/uL — ABNORMAL HIGH (ref 3.87–5.11)
RDW: 12.3 % (ref 11.5–15.5)
WBC: 3.5 10*3/uL — ABNORMAL LOW (ref 4.0–10.5)
nRBC: 0 % (ref 0.0–0.2)

## 2019-03-25 LAB — URINALYSIS, ROUTINE W REFLEX MICROSCOPIC
Bilirubin Urine: NEGATIVE
Glucose, UA: NEGATIVE mg/dL
Ketones, ur: NEGATIVE mg/dL
Leukocytes,Ua: NEGATIVE
Nitrite: NEGATIVE
Protein, ur: NEGATIVE mg/dL
Specific Gravity, Urine: 1.011 (ref 1.005–1.030)
pH: 5 (ref 5.0–8.0)

## 2019-03-25 LAB — I-STAT BETA HCG BLOOD, ED (MC, WL, AP ONLY): I-stat hCG, quantitative: <5 m[IU]/mL (ref <5)

## 2019-03-25 MED ORDER — SODIUM CHLORIDE 0.9 % IV BOLUS
1000.0000 mL | Freq: Once | INTRAVENOUS | Status: AC
  Administered 2019-03-25: 1000 mL via INTRAVENOUS

## 2019-03-25 MED ORDER — KETOROLAC TROMETHAMINE 30 MG/ML IJ SOLN
30.0000 mg | Freq: Once | INTRAMUSCULAR | Status: AC
  Administered 2019-03-25: 30 mg via INTRAVENOUS
  Filled 2019-03-25: qty 1

## 2019-03-25 MED ORDER — ONDANSETRON 4 MG PO TBDP: ORAL_TABLET | ORAL | 0 refills | Status: DC

## 2019-03-25 NOTE — ED Provider Notes (Signed)
[facility] EMERGENCY DEPARTMENT Provider Note   CSN: [id number] Arrival date & time: 03/25/19  1004     History   Chief Complaint Chief Complaint  Patient presents with  . Diarrhea    HPI Linda [name] is a 33 y.o. female.     Patient complains of nausea and diarrhea.  This been going on a few days and she has had fever.  She was tested for COVID 4 days ago but is pending  The history is provided by the patient. No language interpreter was used.  Diarrhea Quality:  Watery Severity:  Moderate Onset quality:  Sudden Timing:  Constant Progression:  Worsening Relieved by:  Nothing Worsened by:  Nothing Associated symptoms: abdominal pain   Associated symptoms: no headaches   Risk factors: no recent antibiotic use     Past Medical History:  Diagnosis Date  . Tachycardia    a. 05/2015 Event monitor: sinus rhythm, no significant arrhythmias. Pt activated strips do not correlate to any significant arrhythmia.  . Tobacco abuse     Patient Active Problem List   Diagnosis Date Noted  . Chest pain 04/17/2015  . Smoker 01/25/2014  . Sinus tachycardia 07/06/2013    Past Surgical History:  Procedure Laterality Date  . KNEE ARTHROSCOPY     x2     OB History   No obstetric history on file.      Home Medications    Prior to Admission medications   Medication Sig Start Date End Date Taking? Authorizing Provider  Albuterol Sulfate (PROAIR RESPICLICK) 416 (90 Base) MCG/ACT AEPB Inhale 2 puffs into the lungs every 4 (four) hours as needed. 10/07/18  Yes Timmothy Euler, [place] D, PA-C  norgestimate-ethinyl estradiol (ORTHO-CYCLEN) 0.25-35 MG-MCG tablet Take 1 tablet by mouth daily.   Yes [provider]  SUMAtriptan (IMITREX) 100 MG tablet Take 100 mg by mouth every 2 (two) hours as needed for migraine. May repeat in 2 hours if headache persists or recurs.   Yes [provider]  amphetamine-dextroamphetamine (ADDERALL) 5 MG tablet Take 5 mg by mouth daily as  needed.  06/09/18   [provider]  ondansetron (ZOFRAN ODT) 4 MG disintegrating tablet 4mg  ODT q4 hours prn nausea/vomit 03/25/19   Milton Ferguson, MD    Family History Family History  Problem Relation Age of Onset  . Hyperlipidemia Mother   . Heart attack Father   . Heart disease Father   . Hypertension Father   . Hyperlipidemia Father     Social History Social History   Tobacco Use  . Smoking status: Former Smoker    Packs/day: 0.25    Years: 12.00    Pack years: 3.00  . Smokeless tobacco: Never Used  Substance Use Topics  . Alcohol use: Yes    Comment: occassional  . Drug use: No     Allergies   Penicillins   Review of Systems Review of Systems  Constitutional: Negative for appetite change and fatigue.  HENT: Negative for congestion, ear discharge and sinus pressure.   Eyes: Negative for discharge.  Respiratory: Negative for cough.   Cardiovascular: Negative for chest pain.  Gastrointestinal: Positive for abdominal pain and diarrhea.  Genitourinary: Negative for frequency and hematuria.  Musculoskeletal: Negative for back pain.  Skin: Negative for rash.  Neurological: Negative for seizures and headaches.  Psychiatric/Behavioral: Negative for hallucinations.     Physical Exam Updated Vital Signs BP 133/85 (BP Location: Left Arm)   Pulse 86   Temp 98 F (36.7  C) (Oral)   Resp 16   Ht 5\' 5"  (1.651 m)   Wt 93 kg   LMP 03/20/2019   SpO2 100%   BMI 34.11 kg/m   Physical Exam Vitals signs and nursing note reviewed.  Constitutional:      Appearance: She is well-developed.  HENT:     Head: Normocephalic.     Nose: Nose normal.  Eyes:     General: No scleral icterus.    Conjunctiva/sclera: Conjunctivae normal.  Neck:     Musculoskeletal: Neck supple.     Thyroid: No thyromegaly.  Cardiovascular:     Rate and Rhythm: Normal rate and regular rhythm.     Heart sounds: No murmur. No friction rub. No gallop.   Pulmonary:     Breath sounds:  No stridor. No wheezing or rales.  Chest:     Chest wall: No tenderness.  Abdominal:     General: There is no distension.     Tenderness: There is no abdominal tenderness. There is no rebound.  Musculoskeletal: Normal range of motion.  Lymphadenopathy:     Cervical: No cervical adenopathy.  Skin:    Findings: No erythema or rash.  Neurological:     Mental Status: She is oriented to person, place, and time.     Motor: No abnormal muscle tone.     Coordination: Coordination normal.  Psychiatric:        Behavior: Behavior normal.      ED Treatments / Results  Labs (all labs ordered are listed, but only abnormal results are displayed) Labs Reviewed  CBC WITH DIFFERENTIAL/PLATELET - Abnormal; Notable for the following components:      Result Value   WBC 3.5 (*)    RBC 5.44 (*)    All other components within normal limits  COMPREHENSIVE METABOLIC PANEL - Abnormal; Notable for the following components:   Sodium 134 (*)    All other components within normal limits  URINALYSIS, ROUTINE W REFLEX MICROSCOPIC - Abnormal; Notable for the following components:   Hgb urine dipstick MODERATE (*)    Bacteria, UA FEW (*)    All other components within normal limits  I-STAT BETA HCG BLOOD, ED ([facility], [facility], [facility] ONLY)    EKG    Radiology Dg Abd Acute 2+v W 1v Chest  Result Date: 03/25/2019 CLINICAL DATA:  Back pain, fever and diarrhea since Wednesday. EXAM: DG ABDOMEN ACUTE W/ 1V CHEST COMPARISON:  04/12/2015 chest x-ray. FINDINGS: The cardiac silhouette, mediastinal and hilar contours are normal. The lungs are clear. No pleural effusion. The bowel gas pattern is unremarkable. No findings for obstruction or perforation. The soft tissue shadows of the abdomen are grossly maintained. No worrisome calcifications. The bony structures are unremarkable. IMPRESSION: 1. No acute cardiopulmonary findings. 2. Unremarkable abdominal radiographs. Electronically Signed   By: Rudie MeyerP.  Gallerani M.D.   On: 03/25/2019  14:25    Procedures Procedures (including critical care time)  Medications Ordered in ED Medications  sodium chloride 0.9 % bolus 1,000 mL (0 mLs Intravenous Stopped 03/25/19 1219)  ketorolac (TORADOL) 30 MG/ML injection 30 mg (30 mg Intravenous Given 03/25/19 1120)     Initial Impression / Assessment and Plan / ED Course  I have reviewed the triage vital signs and the nursing notes.  Pertinent labs & imaging results that were available during my care of the patient were reviewed by me and considered in my medical decision making (see chart for details).       Labs unremarkable.  Patient with persistent diarrhea.  She will be given some Zofran and Imodium and follow-up with her doctor.  Final Clinical Impressions(s) / ED Diagnoses   Final diagnoses:  Diarrhea of infectious origin    ED Discharge Orders         Ordered    ondansetron (ZOFRAN ODT) 4 MG disintegrating tablet     03/25/19 1436           Bethann BerkshireZammit, Gelsey Amyx, MD 03/25/19 1717

## 2019-03-25 NOTE — ED Triage Notes (Signed)
Pt states for the past 4 days she has been having diarrhea with back pain and fever. Fever started Wed and has been as high as 102. Last had antipyretic last night. 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

## 2019-03-25 NOTE — Discharge Instructions (Addendum)
Drink plenty of fluids and follow-up with your doctor next week °

## 2019-12-12 ENCOUNTER — Ambulatory Visit (HOSPITAL_BASED_OUTPATIENT_CLINIC_OR_DEPARTMENT_OTHER): Payer: 59 | Admitting: Genetic Counselor

## 2019-12-12 DIAGNOSIS — Z8481 Family history of carrier of genetic disease: Secondary | ICD-10-CM | POA: Diagnosis not present

## 2019-12-12 DIAGNOSIS — Z800 Family history of malignant neoplasm of digestive organs: Secondary | ICD-10-CM | POA: Diagnosis not present

## 2019-12-12 DIAGNOSIS — Z8052 Family history of malignant neoplasm of bladder: Secondary | ICD-10-CM | POA: Diagnosis not present

## 2019-12-12 DIAGNOSIS — Z8371 Family history of colonic polyps: Secondary | ICD-10-CM

## 2019-12-12 DIAGNOSIS — Z803 Family history of malignant neoplasm of breast: Secondary | ICD-10-CM

## 2019-12-12 DIAGNOSIS — Z808 Family history of malignant neoplasm of other organs or systems: Secondary | ICD-10-CM

## 2019-12-12 DIAGNOSIS — Z801 Family history of malignant neoplasm of trachea, bronchus and lung: Secondary | ICD-10-CM

## 2019-12-15 ENCOUNTER — Encounter: Payer: Self-pay | Admitting: Genetic Counselor

## 2019-12-15 DIAGNOSIS — Z83719 Family history of colon polyps, unspecified: Secondary | ICD-10-CM | POA: Insufficient documentation

## 2019-12-15 DIAGNOSIS — Z800 Family history of malignant neoplasm of digestive organs: Secondary | ICD-10-CM | POA: Insufficient documentation

## 2019-12-15 DIAGNOSIS — Z803 Family history of malignant neoplasm of breast: Secondary | ICD-10-CM | POA: Insufficient documentation

## 2019-12-15 DIAGNOSIS — Z801 Family history of malignant neoplasm of trachea, bronchus and lung: Secondary | ICD-10-CM | POA: Insufficient documentation

## 2019-12-15 DIAGNOSIS — Z808 Family history of malignant neoplasm of other organs or systems: Secondary | ICD-10-CM | POA: Insufficient documentation

## 2019-12-15 DIAGNOSIS — Z8052 Family history of malignant neoplasm of bladder: Secondary | ICD-10-CM | POA: Insufficient documentation

## 2019-12-15 DIAGNOSIS — Z8371 Family history of colonic polyps: Secondary | ICD-10-CM | POA: Insufficient documentation

## 2019-12-15 DIAGNOSIS — Z8481 Family history of carrier of genetic disease: Secondary | ICD-10-CM | POA: Insufficient documentation

## 2019-12-15 NOTE — Progress Notes (Signed)
REFERRING PROVIDER: No referring provider defined for this encounter.  PRIMARY PROVIDER:  Donald Prose, MD  PRIMARY REASON FOR VISIT:  1. Family history of gene mutation   2. Family history of breast cancer   3. Family history of colon cancer   4. Family history of colonic polyps   5. Family history of bladder cancer   6. Family history of lung cancer   7. Family history of basal cell carcinoma       HISTORY OF PRESENT ILLNESS:   Linda [name], a 34 y.o. female, was seen for a [facility] cancer genetics consultation due to a family history of a pathogenic variant in the BRIP1 gene.  Linda [name] presents to clinic today to discuss the possibility of a hereditary predisposition to cancer, genetic testing, and to further clarify her future cancer risks, as well as potential cancer risks for family members.   Linda [name] is a 34 y.o. female with no personal history of cancer.    CANCER HISTORY:  Oncology History   No history exists.     RISK FACTORS:  Menarche was at age 58.  First live birth at age 60.  OCP use for approximately unknown years.  Ovaries intact: yes.  Hysterectomy: no.  Menopausal status: premenopausal.  HRT use: 0 years. Colonoscopy: not yet. Number of breast biopsies: 0.  Past Medical History:  Diagnosis Date  . Family history of basal cell carcinoma   . Family history of bladder cancer   . Family history of breast cancer   . Family history of colon cancer   . Family history of colonic polyps   . Family history of gene mutation    BRIP1 gene  . Family history of lung cancer   . Tachycardia    a. 05/2015 Event monitor: sinus rhythm, no significant arrhythmias. Pt activated strips do not correlate to any significant arrhythmia.  . Tobacco abuse     Past Surgical History:  Procedure Laterality Date  . KNEE ARTHROSCOPY     x2    Social History   Socioeconomic History  . Marital status: Married    Spouse name: Not on file  . Number of children: Not on  file  . Years of education: Not on file  . Highest education level: Not on file  Occupational History  . Not on file  Tobacco Use  . Smoking status: Former Smoker    Packs/day: 0.25    Years: 12.00    Pack years: 3.00  . Smokeless tobacco: Never Used  Substance and Sexual Activity  . Alcohol use: Yes    Comment: occassional  . Drug use: No  . Sexual activity: Not on file  Other Topics Concern  . Not on file  Social History Narrative  . Not on file   Social Determinants of Health   Financial Resource Strain:   . Difficulty of Paying Living Expenses:   Food Insecurity:   . Worried About Charity fundraiser in the Last Year:   . Arboriculturist in the Last Year:   Transportation Needs:   . Film/video editor (Medical):   Marland Kitchen Lack of Transportation (Non-Medical):   Physical Activity:   . Days of Exercise per Week:   . Minutes of Exercise per Session:   Stress:   . Feeling of Stress :   Social Connections:   . Frequency of Communication with Friends and Family:   . Frequency of Social Gatherings with Friends and Family:   .  Attends Religious Services:   . Active Member of Clubs or Organizations:   . Attends Archivist Meetings:   Marland Kitchen Marital Status:      FAMILY HISTORY:  We obtained a detailed, 4-generation family history.  Significant diagnoses are listed below: Family History  Problem Relation Age of Onset  . Hyperlipidemia Mother   . Breast cancer Mother 80  . Heart attack Father   . Heart disease Father   . Hypertension Father   . Hyperlipidemia Father   . Colon cancer Father 9  . Colon polyps Father        More than 10 adenomas  . Other Father        BRIP1 gene mutation, c.2392C>T  . Bladder Cancer Maternal Uncle 51  . Breast cancer Paternal Aunt        dx. older than 74  . Colon cancer Paternal Aunt        dx. older than 69  . Cancer Paternal Uncle        unknown type  . Basal cell carcinoma Maternal Grandfather        dx. in his 69s  .  Lung cancer Paternal Grandmother 61       smoker  . Breast cancer Paternal Aunt        dx. older than 81  . Colon cancer Paternal Aunt        dx. older than 76   Linda [name] has a son who is 16 and has not had cancer. She has two sisters (ages 22 and 21) and a brother (age 60).  Linda [name] mother is currently 57 and has a history of breast cancer diagnosed when she was 83. Linda [name] has one maternal uncle and one maternal aunt. Her uncle had bladder cancer diagnosed when he was around the age of 64. Her maternal grandmother died at age 16 and had a history of basal cell carcinoma diagnosed in her 74s. Her maternal grandfather died at age 75 and did not have cancer.  Linda [name] father is currently 22 and has a history of colon cancer diagnosed at age 83, multiple adenomatous colon polyps (more than 10), and a pathogenic variant in the BRIP1 gene identified on genetic testing. Linda [name] has two paternal aunts and one paternal uncle. Both of her paternal aunts have a history of breast and colon cancer diagnosed when they were older than 30. Her paternal uncle died from an unknown type of cancer when he was 41. Linda [name] paternal grandmother died at age 47 from lung cancer and was a smoker. Her paternal grandfather died at age 109 and did not have cancer.   Linda [name] is aware of previous family history of genetic testing for hereditary cancer risks in her father, who has a pathogenic variant in the BRIP1 gene called c.2392C>T (p.R798*). Her maternal ancestors are of [place] and [place] descent, and paternal ancestors are of unknown descent. There is no reported Ashkenazi Jewish ancestry. There is no known consanguinity.  GENETIC COUNSELING ASSESSMENT: Linda [name] is a 34 y.o. female with a family history of a pathogenic variant in the BRIP1 gene, as well as a family history of breast and colon cancer which is suggestive of a predisposition to cancer. We, therefore, discussed and recommended the following at  today's visit.   DISCUSSION: We discussed that Linda [name] has a 50% (1 in 2) chance to also have the BRIP1 variant that was discovered in her father. We discussed that women  who are carriers of a single pathogenic variant in the BRIP1 gene have an increased risk of ovarian cancer, and possibly breast cancer. The risk for ovarian cancer in these women is estimated to be up to 9.1% (compared to the general population ovarian cancer risk of approximately 1.3%). There is preliminary evidence supporting a correlation with BRIP1 and predisposition to breast cancer; however, the available evidence is insufficient to make a determination regarding this relationship. An individual with a BRIP1 pathogenic variant will not necessarily develop cancer in their lifetime, but the risk for cancer is increased over the general population risk.    Based on current understanding, men don't appear to have increased cancer risks due to BRIP1 mutations. However, men can carry the mutation and pass it on to their children. Additionally, information regarding female cancer risk could change as more research is done to understand BRIP1 mutations.  We discussed that testing is beneficial for multiple reasons including knowing about potential cancer risks and identifying screening and risk-reduction options that may be appropriate.  We reviewed the characteristics, features and inheritance patterns of hereditary cancer syndromes. We also discussed genetic testing, including the appropriate family members to test, the process of testing, insurance coverage, genetic discrimination, and turn-around-time for results. We discussed the implications of a negative vs a positive result.  In particular, we discussed that even if her results are negative, she may still be at an increased risk for cancer based on the family history.    We recommended Linda [name] pursue genetic testing for the known familial variant in BRIP1, called c.2392C>T, through  [facility]. Because Linda [name]'s father had genetic testing that identified a pathogenic variant through [facility], testing in Linda [name] will be free of charge if carried out through the same genetic testing laboratory within 90 days of the original report.   Although her maternal family history is not highly suggestive of a hereditary cancer syndrome, we also discussed the option to pursue genetic testing for additional cancer genes based on her maternal family history of cancer. This additional testing would be billed by the genetic testing laboratory and may result in an out-of-pocket cost for Linda [name], despite that she meets medical criteria for genetic testing based on her father's positive result. We discussed that if her out of pocket cost for testing is over $100, the laboratory will call and confirm whether she wants to proceed with testing.  If the out of pocket cost of testing is less than $100 she will be billed by the genetic testing laboratory. After considering this information, Linda [name] decided that she would prefer to pursue testing for the familial BRIP1 variant only at this time.  Lastly, we discussed that some people do not want to undergo genetic testing due to fear of genetic discrimination.  A federal law called the Genetic Information Non-Discrimination Act (GINA) of 2008 helps protect individuals against genetic discrimination based on their genetic test results.  It impacts both health insurance and employment.  With health insurance, it protects against increased premiums, being kicked off insurance or being forced to take a test in order to be insured.  For employment it protects against hiring, firing and promoting decisions based on genetic test results.  Health status due to a cancer diagnosis is not protected under GINA.  Additionally, life, disability, and long-term care insurance is not protected under GINA.   PLAN: After considering the risks, benefits, and limitations, Linda [name].  [name] provided informed consent to pursue genetic testing  and the saliva sample will be sent to [facility] for analysis of the known familial variant in the BRIP1 gene. Results should be available within approximately two-three weeks' time, at which point they will be disclosed by telephone to Linda [name]. Barcus, as will any additional recommendations warranted by these results. Linda [name]. Ragan will receive a summary of her genetic counseling visit and a copy of her results once available. This information will also be available in Epic.   Linda [name]. Ammon questions were answered to her satisfaction today. Our contact information was provided should additional questions or concerns arise. Thank you for the referral and allowing [place] to share in the care of your patient.   Clint Guy, Linda [name], [facility] Genetic Counselor [email or web address] Phone: [phone]  The patient was seen for a total of 40 minutes in face-to-face genetic counseling.  This patient was discussed with Drs. Magrinat, Lindi Adie and/or Burr Medico who agrees with the above.    _______________________________________________________________________ For Office Staff:  Number of people involved in session: 1 Was an Intern/ student involved with case: no

## 2020-01-26 ENCOUNTER — Telehealth: Payer: Self-pay | Admitting: Genetic Counselor

## 2020-01-26 NOTE — Telephone Encounter (Signed)
LVM to check on the status of her saliva kit for genetic testing, since the laboratory has not yet received her sample. Left callback number to discuss any questions or concerns she may have.

## 2020-02-14 ENCOUNTER — Ambulatory Visit: Payer: Self-pay | Admitting: Genetic Counselor

## 2020-02-14 ENCOUNTER — Telehealth: Payer: Self-pay | Admitting: Genetic Counselor

## 2020-02-14 ENCOUNTER — Encounter: Payer: Self-pay | Admitting: Genetic Counselor

## 2020-02-14 DIAGNOSIS — Z1379 Encounter for other screening for genetic and chromosomal anomalies: Secondary | ICD-10-CM

## 2020-02-14 NOTE — Telephone Encounter (Signed)
Revealed negative genetic testing. Linda [name] did not inherit the BRIP1 mutation identified in her father's genetic testing.

## 2020-02-15 NOTE — Progress Notes (Signed)
HPI:  Linda [name] was previously seen in the [facility] clinic due to a family history of a BRIP1 pathogenic variant in her father and concerns regarding a hereditary predisposition to cancer. Please refer to our prior cancer genetics clinic note for more information regarding our discussion, assessment and recommendations, at the time. Linda [name] recent genetic test results were disclosed to her, as were recommendations warranted by these results. These results and recommendations are discussed in more detail below.  FAMILY HISTORY:  We obtained a detailed, 4-generation family history.  Significant diagnoses are listed below: Family History  Problem Relation Age of Onset  . Hyperlipidemia Mother   . Breast cancer Mother 56  . Heart attack Father   . Heart disease Father   . Hypertension Father   . Hyperlipidemia Father   . Colon cancer Father 32  . Colon polyps Father        More than 10 adenomas  . Other Father        BRIP1 gene mutation, c.2392C>T  . Bladder Cancer Maternal Uncle 61  . Breast cancer Paternal Aunt        dx. older than 37  . Colon cancer Paternal Aunt        dx. older than 37  . Cancer Paternal Uncle        unknown type  . Basal cell carcinoma Maternal Grandfather        dx. in his 27s  . Lung cancer Paternal Grandmother 3       smoker  . Breast cancer Paternal Aunt        dx. older than 23  . Colon cancer Paternal Aunt        dx. older than 92   Linda [name] has a son who is 79 and has not had cancer. She has two sisters (ages 59 and 20) and a brother (age 25).  Linda [name] mother is currently 44 and has a history of breast cancer diagnosed when she was 36. Linda [name] has one maternal uncle and one maternal aunt. Her uncle had bladder cancer diagnosed when he was around the age of 54. Her maternal grandmother died at age 12 and had a history of basal cell carcinoma diagnosed in her 31s. Her maternal grandfather died at age 26 and did not have  cancer.  Linda [name] father is currently 17 and has a history of colon cancer diagnosed at age 80, multiple adenomatous colon polyps (more than 10), and a pathogenic variant in the BRIP1 gene identified on genetic testing. [name] has two paternal aunts and one paternal uncle. [name] of her paternal aunts have a history of breast and colon cancer diagnosed when they were older than 29. Her paternal uncle died from an unknown type of cancer when he was 72. [name] paternal grandmother died at age 35 from lung cancer and was a smoker. Her paternal grandfather died at age 58 and did not have cancer.   Linda [name] is aware of previous family history of genetic testing for hereditary cancer risks in her father, who has a pathogenic variant in the BRIP1 gene called c.2392C>T (p.R798*). Her maternal ancestors are of [place] and [place] descent, and paternal ancestors are of unknown descent. There is no reported Ashkenazi Jewish ancestry. There is no known consanguinity.  GENETIC TEST RESULTS: Genetic testing reported out on 02/13/2020 through [facility] of the BRIP1 gene. No pathogenic variants were detected.   The test report  will be scanned into EPIC and located under the Molecular Pathology section of the Results Review tab.  A portion of the result report is included below for reference.     We recommended Linda [name] pursue testing for the familial hereditary cancer mutation in the BRIP1 gene called c.2392C>T (p.R798*). Linda [name] test was normal and did not reveal the familial mutation. We call this result a true negative result because the cancer-causing mutation was identified in Linda [name]'s family, and she did not inherit it.  Given this negative result, Linda [name]'s chances of developing BRIP1-related cancers are the same as they are in the [name] population.    ADDITIONAL GENETIC TESTING: We discussed with Linda [name] that there are other genes that are associated with  increased cancer risk that can be analyzed. Should Linda [name] wish to pursue additional genetic testing, we are happy to discuss and coordinate this testing, at any time.    CANCER SCREENING RECOMMENDATIONS: Linda [name] test result is considered negative (normal). While reassuring, this does not definitively rule out a hereditary predisposition to cancer. It is possible that there could be genetic mutations in genes that have not been tested or identified to increase cancer risk. Therefore, it is recommended she continue to follow the cancer management and screening guidelines provided by her primary healthcare providers.   An individual's cancer risk and medical management are not determined by genetic test results alone. Overall cancer risk assessment incorporates additional factors, including personal medical history, family history, and any available genetic information that may result in a personalized plan for cancer prevention and surveillance.  Based on Linda [name]'s personal and family history, as well as her genetic test results, a statistical model Midwife) was used to estimate her risk of developing breast cancer. Tyrer-Cuzick estimates her lifetime risk of developing breast cancer to be approximately 23.1%. This lifetime breast cancer risk is a preliminary estimate based on available information using one of several models endorsed by the [place] (ACS). The ACS recommends consideration of breast MRI screening as an adjunct to mammography for patients at high risk (defined as 20% or greater lifetime risk).   Linda [name] has been determined to be at high risk for breast cancer. Therefore, we recommend that annual screening with mammography and breast MRI begin at age 59, or 10 years prior to the age of breast cancer diagnosis in a relative (whichever is earlier). We discussed that Linda [name] should discuss her individual situation with her primary care physician or OBGYN to  determine a breast cancer screening plan with which they are [name] comfortable.       Linda [name] is also likely at an increased risk for colorectal cancer based on her family history. Per the NCCN Guidelines (Colorectal Cancer Screening Guidelines, Version 2.2021), she should have a colonoscopy every 5 years, or as determined by her GI doctors, beginning at age 39 (or 65 years younger than the earliest diagnosis, whichever is first).  RECOMMENDATIONS FOR FAMILY MEMBERS:  Individuals in this family might be at some increased risk of developing cancer, over the [name] population risk, simply due to the family history of cancer.  We recommended women in this family have a yearly mammogram beginning at age 22, or 3 years younger than the earliest onset of cancer, an annual clinical breast exam, and perform monthly breast self-exams. Women in this family should also have a gynecological exam as recommended by their primary provider. All family members should have a colonoscopy by  age 45-50.  FOLLOW-UP: Lastly, we discussed with Ms. Oka that cancer genetics is a rapidly advancing field and it is possible that new genetic tests will be appropriate for her and/or her family members in the future. We encouraged her to remain in contact with cancer genetics on an annual basis so we can update her personal and family histories and let her know of advances in cancer genetics that may benefit this family.   Our contact number was provided. Ms. Balash questions were answered to her satisfaction, and she knows she is welcome to call us at anytime with additional questions or concerns.   Clint Guy, MS, [facility] Genetic Counselor [email or web address] Phone: [phone]

## 2020-04-10 DIAGNOSIS — H52223 Regular astigmatism, bilateral: Secondary | ICD-10-CM | POA: Diagnosis not present

## 2020-04-10 DIAGNOSIS — H5213 Myopia, bilateral: Secondary | ICD-10-CM | POA: Diagnosis not present

## 2020-07-02 ENCOUNTER — Other Ambulatory Visit: Payer: Self-pay | Admitting: Obstetrics and Gynecology

## 2020-07-02 DIAGNOSIS — Z1231 Encounter for screening mammogram for malignant neoplasm of breast: Secondary | ICD-10-CM

## 2020-07-02 DIAGNOSIS — Z01419 Encounter for gynecological examination (general) (routine) without abnormal findings: Secondary | ICD-10-CM | POA: Diagnosis not present

## 2020-07-02 DIAGNOSIS — Z809 Family history of malignant neoplasm, unspecified: Secondary | ICD-10-CM | POA: Diagnosis not present

## 2020-09-11 ENCOUNTER — Other Ambulatory Visit: Payer: Self-pay | Admitting: Student

## 2020-09-11 ENCOUNTER — Other Ambulatory Visit: Payer: Self-pay

## 2020-09-11 ENCOUNTER — Ambulatory Visit
Admission: RE | Admit: 2020-09-11 | Discharge: 2020-09-11 | Disposition: A | Discharge status: Home / Self Care | Payer: 59 | Source: Ambulatory Visit | Attending: Obstetrics and Gynecology | Admitting: Obstetrics and Gynecology

## 2020-09-11 DIAGNOSIS — Z1231 Encounter for screening mammogram for malignant neoplasm of breast: Secondary | ICD-10-CM | POA: Insufficient documentation

## 2020-09-11 DIAGNOSIS — M47816 Spondylosis without myelopathy or radiculopathy, lumbar region: Secondary | ICD-10-CM | POA: Diagnosis not present

## 2020-09-11 DIAGNOSIS — M79645 Pain in left finger(s): Secondary | ICD-10-CM | POA: Diagnosis not present

## 2020-09-11 DIAGNOSIS — S63634A Sprain of interphalangeal joint of right ring finger, initial encounter: Secondary | ICD-10-CM | POA: Diagnosis not present

## 2020-09-11 DIAGNOSIS — M461 Sacroiliitis, not elsewhere classified: Secondary | ICD-10-CM | POA: Diagnosis not present

## 2020-09-11 DIAGNOSIS — M5441 Lumbago with sciatica, right side: Secondary | ICD-10-CM | POA: Diagnosis not present

## 2020-09-23 ENCOUNTER — Other Ambulatory Visit: Payer: Self-pay | Admitting: Obstetrics and Gynecology

## 2020-09-23 ENCOUNTER — Other Ambulatory Visit: Payer: Self-pay

## 2020-09-23 ENCOUNTER — Encounter (HOSPITAL_COMMUNITY): Payer: Self-pay | Admitting: Physical Therapy

## 2020-09-23 ENCOUNTER — Ambulatory Visit (HOSPITAL_COMMUNITY): Payer: 59 | Attending: Family Medicine | Admitting: Physical Therapy

## 2020-09-23 DIAGNOSIS — N6489 Other specified disorders of breast: Secondary | ICD-10-CM

## 2020-09-23 DIAGNOSIS — R928 Other abnormal and inconclusive findings on diagnostic imaging of breast: Secondary | ICD-10-CM

## 2020-09-23 DIAGNOSIS — M6281 Muscle weakness (generalized): Secondary | ICD-10-CM

## 2020-09-23 DIAGNOSIS — M7918 Myalgia, other site: Secondary | ICD-10-CM

## 2020-09-23 DIAGNOSIS — M461 Sacroiliitis, not elsewhere classified: Secondary | ICD-10-CM

## 2020-09-23 DIAGNOSIS — R262 Difficulty in walking, not elsewhere classified: Secondary | ICD-10-CM

## 2020-09-23 NOTE — Therapy (Signed)
[place], [place], [address] Phone: [phone]   Fax:  [phone]  Physical Therapy Evaluation  Patient Details  Name: Linda [name] MRN: [id number] Date of Birth: [birth year] Referring Provider (PT): Damaris Hippo   Encounter Date: 09/23/2020   PT End of Session - 09/23/20 1617    Visit Number 1    Number of Visits 12    Date for PT Re-Evaluation 11/04/20    Authorization Type [place] [employer], no visit limit, no auth. Medical check after 25th visit    PT Start Time 1533    PT Stop Time 1619    PT Time Calculation (min) 46 min    Activity Tolerance Patient tolerated treatment well    Behavior During Therapy [facility] for tasks assessed/performed           Past Medical History:  Diagnosis Date  . Family history of basal cell carcinoma   . Family history of bladder cancer   . Family history of breast cancer   . Family history of colon cancer   . Family history of colonic polyps   . Family history of gene mutation    BRIP1 gene  . Family history of lung cancer   . Tachycardia    a. 05/2015 Event monitor: sinus rhythm, no significant arrhythmias. Pt activated strips do not correlate to any significant arrhythmia.  . Tobacco abuse     Past Surgical History:  Procedure Laterality Date  . KNEE ARTHROSCOPY     x2    There were no vitals filed for this visit.    Subjective Assessment - 09/23/20 1541    Subjective Patient reports increasing right buttock pain which refers along lateral thigh.  Patient reports that she was swimming for exercise but the pain in buttocks was aggravated by these motions.  Recently received steriod injection to right SI joint that only provided a few days of relief. Patient denies any single mechanism of injury and pain has been progressing over the past three months.  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 20-30 min    How long can you stand comfortably? 20-30 min    How long can you walk comfortably? 20-30 min    Patient Stated Goals Feel better and get back to activity and pain-free work duties    Currently in Pain? Yes    Pain Score 6     Pain Location Buttocks    Pain Orientation Right    Pain Descriptors / Indicators Aching;Sharp    Pain Type Chronic pain    Pain Radiating Towards radiates right lateral thigh to mid thigh    Pain Onset More than a month ago    Pain Frequency Intermittent    Aggravating Factors  weight shifting to RLE. Prolonged sitting, standing, walking    Pain Relieving Factors sitting on heat pack              [facility] PT Assessment - 09/23/20 0001      Assessment   Medical Diagnosis right sacroilitis    Referring Provider (PT) Damaris Hippo    Next MD Visit prn    Prior Therapy chiropractic      Balance Screen   Has the patient fallen in the past 6 months No    Has  the patient had a decrease in activity level because of a fear of falling?  No    Is the patient reluctant to leave their home because of a fear of falling?  No      Prior Function   Level of Independence Independent    Vocation Full time employment    Engineer, mining on hospital floor    Leisure swimming      Observation/Other Assessments   Focus on Therapeutic Outcomes (FOTO)  40% function      ROM / Strength   AROM / PROM / Strength AROM;Strength      AROM   Overall AROM  --   excessive bilateral hip internal rotation   Right Hip Internal Rotation  65    Left Hip Internal Rotation  65      Strength   Right Hip Extension 3-/5    Right Hip External Rotation  3+/5    Right Hip Internal Rotation 4/5    Right Hip ABduction 3+/5    Left Hip Extension 4-/5    Left Hip External Rotation 4/5    Left Hip Internal Rotation 5/5    Left Hip ABduction 5/5       Palpation   SI assessment  +FABER, +Compression test, -Active SLR, - leg length discrepancy    Palpation comment unremarkable                      Objective measurements completed on examination: See above findings.       [facility] Adult PT Treatment/Exercise - 09/23/20 0001      Exercises   Exercises Knee/Hip      Knee/Hip Exercises: Supine   Other Supine Knee/Hip Exercises muscle energy technique for SI joint rotation      Knee/Hip Exercises: Prone   Other Prone Exercises prone gluteal isometric 3x10                  PT Education - 09/23/20 1611    Education Details Patient educated on SI anatomy and summary of exam findings and HEP activities Patient educated on use of SIJ belt   Person(s) Educated Patient    Methods Explanation    Comprehension Verbalized understanding;Returned demonstration            PT Short Term Goals - 09/23/20 1638      PT SHORT TERM GOAL #1   Title Patient will be independent with HEP in order to improve functional outcomes.    Baseline in development    Time 3    Period Weeks    Status New    Target Date 10/14/20      PT SHORT TERM GOAL #2   Title Patient will demonstrate 4/5 strength right hip extension and hip abduction with report of 2/10 pain to improve loading response during gait    Baseline 3+/5 with 6/10 pain    Time 3    Period Weeks    Status New    Target Date 10/14/20      PT SHORT TERM GOAL #3   Title Patient will report at least 50% improvement in overall symptoms and function to demonstrate overall improved functional ability    Baseline 40% function per FOTO    Time 3    Period Weeks    Status New    Target Date 10/14/20             PT Long Term Goals - 09/23/20 1641  PT LONG TERM GOAL #1   Title Patient will improve on FOTO score to meet predicted outcomes to improve functional independence    Baseline 40% function    Time 6    Period Weeks    Status New    Target Date 11/04/20       PT LONG TERM GOAL #2   Title Patient will report pain not exceeding 1/10 during recreational activities, e.g. walking, swimming    Baseline 6/10 pain with activities    Time 6    Period Weeks    Status New    Target Date 11/04/20                  Plan - 09/23/20 1619    Clinical Impression Statement Patient exhibits right sacroiliac pain which is exacerbated with single leg stance RLE, pain with gluteal contraction, loading response during gait and demonstrates excessive femoral internal rotation.  As a result patient exhibits generalized RLE weakness, difficulty with walking, lifting, carrying tasks which limits her activity tolerance and participation in work responsibilities and leisure activities.    Personal Factors and Comorbidities Profession    Examination-Activity Limitations Squat;Sit;Carry;Lift    Examination-Participation Restrictions Community Activity;Occupation;Yard Work    Stability/Clinical Decision Making Stable/Uncomplicated    Designer, [employer]    Rehab Potential Good    PT Frequency 2x / week    PT Duration 6 weeks    PT Treatment/Interventions ADLs/Self Care Home Management;Aquatic Therapy;Biofeedback;Cryotherapy;Electrical Stimulation;DME Instruction;Ultrasound;Traction;Moist Solicitor;Iontophoresis 70m/ml Dexamethasone;Functional mobility training;Therapeutic activities;Therapeutic exercise;Balance training;Patient/family education;Neuromuscular re-education;Manual techniques;Passive range of motion;Taping;Dry needling;Spinal Manipulations;Joint Manipulations    PT Next Visit Plan Continue with strengthening for right glutes in a pain-free manner. Follow up with SIJ belt, muscle energy techniques, core strength    PT Home Exercise Plan prone glute squeeze, muscle energy techniuqe for SI rotation    Consulted and Agree with Plan of Care Patient           Patient will benefit from skilled therapeutic intervention in  order to improve the following deficits and impairments:  Abnormal gait,Decreased activity tolerance,Decreased endurance,Decreased strength,Hypermobility,Difficulty walking,Impaired perceived functional ability,Improper body mechanics,Pain  Visit Diagnosis: Sacroiliitis ([place]  Difficulty in walking, not elsewhere classified  Right buttock pain  Muscle weakness (generalized)     Problem List Patient Active Problem List   Diagnosis Date Noted  . Genetic testing 02/14/2020  . Family history of gene mutation   . Family history of breast cancer   . Family history of colon cancer   . Family history of colonic polyps   . Family history of bladder cancer   . Family history of lung cancer   . Family history of basal cell carcinoma   . Chest pain 04/17/2015  . Smoker 01/25/2014  . Sinus tachycardia 07/06/2013   4:50 PM, 09/23/20 M. KSherlyn Lees PT, DPT Physical Therapist- [place] Office Number: [phone] [place] [address]Phone: [phone]  Fax:  [phone] Name: JFATIMAH SUNDQUISTMRN: [id number] of Birth: 404-25-1987

## 2020-09-26 ENCOUNTER — Ambulatory Visit (HOSPITAL_COMMUNITY): Payer: 59

## 2020-09-26 ENCOUNTER — Ambulatory Visit
Admission: RE | Admit: 2020-09-26 | Discharge: 2020-09-26 | Disposition: A | Discharge status: Home / Self Care | Payer: 59 | Source: Ambulatory Visit | Attending: Obstetrics and Gynecology | Admitting: Obstetrics and Gynecology

## 2020-09-26 ENCOUNTER — Other Ambulatory Visit: Payer: Self-pay

## 2020-09-26 ENCOUNTER — Encounter (HOSPITAL_COMMUNITY): Payer: Self-pay

## 2020-09-26 DIAGNOSIS — M461 Sacroiliitis, not elsewhere classified: Secondary | ICD-10-CM

## 2020-09-26 DIAGNOSIS — M7918 Myalgia, other site: Secondary | ICD-10-CM

## 2020-09-26 DIAGNOSIS — N6489 Other specified disorders of breast: Secondary | ICD-10-CM | POA: Insufficient documentation

## 2020-09-26 DIAGNOSIS — R928 Other abnormal and inconclusive findings on diagnostic imaging of breast: Secondary | ICD-10-CM | POA: Insufficient documentation

## 2020-09-26 DIAGNOSIS — R262 Difficulty in walking, not elsewhere classified: Secondary | ICD-10-CM

## 2020-09-26 DIAGNOSIS — M6281 Muscle weakness (generalized): Secondary | ICD-10-CM | POA: Diagnosis not present

## 2020-09-26 NOTE — Therapy (Signed)
[place], [place], [address] Phone: [phone]   Fax:  [phone]  Physical Therapy Treatment  Patient Details  Name: Linda [name] MRN: [id number] Date of Birth: 1985-12-26 Referring Provider (PT): Damaris Hippo   Encounter Date: 09/26/2020   PT End of Session - 09/26/20 1404    Visit Number 2    Number of Visits 12    Date for PT Re-Evaluation 11/04/20    Authorization Type [place] [employer], no visit limit, no auth. Medical check after 25th visit    PT Start Time 1358    PT Stop Time 1440    PT Time Calculation (min) 42 min    Activity Tolerance Patient tolerated treatment well    Behavior During Therapy [facility] for tasks assessed/performed           Past Medical History:  Diagnosis Date  . Family history of basal cell carcinoma   . Family history of bladder cancer   . Family history of breast cancer   . Family history of colon cancer   . Family history of colonic polyps   . Family history of gene mutation    BRIP1 gene  . Family history of lung cancer   . Tachycardia    a. 05/2015 Event monitor: sinus rhythm, no significant arrhythmias. Pt activated strips do not correlate to any significant arrhythmia.  . Tobacco abuse     Past Surgical History:  Procedure Laterality Date  . KNEE ARTHROSCOPY     x2    There were no vitals filed for this visit.   Subjective Assessment - 09/26/20 1403    Subjective Pt reports she is feeling good today, no reports of pain.  Reports she received the SI belt yesterday while she was working.  No questions concerning the belt.  Has the day off today.    Patient Stated Goals Feel better and get back to activity and pain-free work duties    Currently in Pain? No/denies                             [facility] Adult PT Treatment/Exercise - 09/26/20 0001      Exercises   Exercises Lumbar      Lumbar Exercises: Supine   Ab Set 10 reps;5 seconds    AB Set  Limitations paired wiht breathing    Clam 10 reps;3 seconds    Clam Limitations GTB around thigh, Isometric abd with ER 5" holds    Bridge 5 reps;5 seconds    Bridge Limitations 2 sets belt around hip isometric abd then raise; reports pain reduced with NBOS during bridge    Other Supine Lumbar Exercises pubic clearing wiht isometric add/abd alternating with ball and belt      Lumbar Exercises: Prone   Straight Leg Raise 10 reps    Straight Leg Raises Limitations cueing to reduce rotation    Other Prone Lumbar Exercises prone heel squeeze 10x 5"                  PT Education - 09/26/20 1422    Education Details Reviewed goals, educated importance of HEP compliance for maximal benefits, pt able to recall and demonstrate appropriate mechanics wiht min cueing for mechanics and to continue breathing.    Person(s) Educated Patient    Methods Explanation;Demonstration    Comprehension Verbalized understanding;Returned demonstration  PT Short Term Goals - 09/23/20 1638      PT SHORT TERM GOAL #1   Title Patient will be independent with HEP in order to improve functional outcomes.    Baseline in development    Time 3    Period Weeks    Status New    Target Date 10/14/20      PT SHORT TERM GOAL #2   Title Patient will demonstrate 4/5 strength right hip extension and hip abduction with report of 2/10 pain to improve loading response during gait    Baseline 3+/5 with 6/10 pain    Time 3    Period Weeks    Status New    Target Date 10/14/20      PT SHORT TERM GOAL #3   Title Patient will report at least 50% improvement in overall symptoms and function to demonstrate overall improved functional ability    Baseline 40% function per FOTO    Time 3    Period Weeks    Status New    Target Date 10/14/20             PT Long Term Goals - 09/23/20 1641      PT LONG TERM GOAL #1   Title Patient will improve on FOTO score to meet predicted outcomes to improve  functional independence    Baseline 40% function    Time 6    Period Weeks    Status New    Target Date 11/04/20      PT LONG TERM GOAL #2   Title Patient will report pain not exceeding 1/10 during recreational activities, e.g. walking, swimming    Baseline 6/10 pain with activities    Time 6    Period Weeks    Status New    Target Date 11/04/20                 Plan - 09/26/20 1425    Clinical Impression Statement Reviewed goals, educated importance of HEP complaince for maximal benefits.  Pt able to recall current HEP and demonstrate with min cueing for mechanics and breathing through session.  Session focus on core and proximal strengthening with cueing for stability with task.  Pt reports initial pain with bridges, cueing for knee/hip/ankle alignment as well as core engangement reduced pain with exercise.  No reports of pain at EOS.    Personal Factors and Comorbidities Profession    Examination-Activity Limitations Squat;Sit;Carry;Lift    Examination-Participation Restrictions Community Activity;Occupation;Yard Work           Patient will benefit from skilled therapeutic intervention in order to improve the following deficits and impairments:  Abnormal gait,Decreased activity tolerance,Decreased endurance,Decreased strength,Hypermobility,Difficulty walking,Impaired perceived functional ability,Improper body mechanics,Pain  Visit Diagnosis: Sacroiliitis ([place])  Difficulty in walking, not elsewhere classified  Right buttock pain  Muscle weakness (generalized)     Problem List Patient Active Problem List   Diagnosis Date Noted  . Genetic testing 02/14/2020  . Family history of gene mutation   . Family history of breast cancer   . Family history of colon cancer   . Family history of colonic polyps   . Family history of bladder cancer   . Family history of lung cancer   . Family history of basal cell carcinoma   . Chest pain 04/17/2015  . Smoker 01/25/2014   . Sinus tachycardia 07/06/2013   Ihor Austin, LPTA/CLT; [place]  Aldona Lento 09/26/2020, 2:54 PM  [facility]  [place], [place], [address] Phone: [phone]   Fax:  [phone]  Name: Linda [name] MRN: [id number] Date of Birth: Jun 13, 1986

## 2020-09-30 ENCOUNTER — Ambulatory Visit (HOSPITAL_COMMUNITY): Payer: 59

## 2020-09-30 ENCOUNTER — Telehealth (HOSPITAL_COMMUNITY): Payer: Self-pay

## 2020-09-30 NOTE — Telephone Encounter (Signed)
pt called to cx this appt due to she worked late last night and overslept this morning

## 2020-10-02 ENCOUNTER — Other Ambulatory Visit: Payer: Self-pay

## 2020-10-02 ENCOUNTER — Ambulatory Visit (HOSPITAL_COMMUNITY): Payer: 59 | Admitting: Physical Therapy

## 2020-10-02 ENCOUNTER — Encounter (HOSPITAL_COMMUNITY): Payer: Self-pay | Admitting: Physical Therapy

## 2020-10-02 DIAGNOSIS — R262 Difficulty in walking, not elsewhere classified: Secondary | ICD-10-CM | POA: Diagnosis not present

## 2020-10-02 DIAGNOSIS — M461 Sacroiliitis, not elsewhere classified: Secondary | ICD-10-CM | POA: Diagnosis not present

## 2020-10-02 DIAGNOSIS — M6281 Muscle weakness (generalized): Secondary | ICD-10-CM

## 2020-10-02 DIAGNOSIS — M7918 Myalgia, other site: Secondary | ICD-10-CM | POA: Diagnosis not present

## 2020-10-02 NOTE — Therapy (Signed)
[place], [place], [address] Phone: [phone]   Fax:  [phone]  Physical Therapy Treatment  Patient Details  Name: Linda [name] MRN: [id number] Date of Birth: [birth year] Referring Provider (PT): Damaris Hippo   Encounter Date: 10/02/2020   PT End of Session - 10/02/20 0958    Visit Number 3    Number of Visits 12    Date for PT Re-Evaluation 11/04/20    Authorization Type [place] [employer], no visit limit, no auth. Medical check after 25th visit    PT Start Time [phone]    PT Stop Time 1028    PT Time Calculation (min) 42 min    Activity Tolerance Patient tolerated treatment well    Behavior During Therapy [facility] for tasks assessed/performed           Past Medical History:  Diagnosis Date  . Family history of basal cell carcinoma   . Family history of bladder cancer   . Family history of breast cancer   . Family history of colon cancer   . Family history of colonic polyps   . Family history of gene mutation    BRIP1 gene  . Family history of lung cancer   . Tachycardia    a. 05/2015 Event monitor: sinus rhythm, no significant arrhythmias. Pt activated strips do not correlate to any significant arrhythmia.  . Tobacco abuse     Past Surgical History:  Procedure Laterality Date  . KNEE ARTHROSCOPY     x2    There were no vitals filed for this visit.   Subjective Assessment - 10/02/20 0957    Subjective Hip has been fine, wears belt all day at work and that helps but knee on right has been bothering her. Current pain is 5/10and hurts with driving and moving it to the brakes. States pain is right behind her kneecap and around it. States she has had 2 knee surgeries on her knee and started about 2-3days ago (the pain).    Patient Stated Goals Feel better and get back to activity and pain-free work duties    Currently in Pain? Yes    Pain Score 5     Pain Location Knee    Pain Orientation Right    Pain  Type Acute pain              [facility] PT Assessment - 10/02/20 0001      Assessment   Medical Diagnosis right sacroilitis    Referring Provider (PT) Damaris Hippo                         [facility] Adult PT Treatment/Exercise - 10/02/20 0001      Lumbar Exercises: Supine   Ab Set 20 reps   upper abdominal contraction, focus on long exhale   Bridge 5 seconds   3x10 with core contraction   Other Supine Lumbar Exercises hamstring curls 3x10 with ball                  PT Education - 10/02/20 1003    Education Details on breathing techniwues, rational for upper abdominal contraction and anatomy    Person(s) Educated Patient    Methods Explanation    Comprehension Verbalized understanding            PT Short Term Goals - 09/23/20 1638      PT SHORT TERM GOAL #1   Title Patient  will be independent with HEP in order to improve functional outcomes.    Baseline in development    Time 3    Period Weeks    Status New    Target Date 10/14/20      PT SHORT TERM GOAL #2   Title Patient will demonstrate 4/5 strength right hip extension and hip abduction with report of 2/10 pain to improve loading response during gait    Baseline 3+/5 with 6/10 pain    Time 3    Period Weeks    Status New    Target Date 10/14/20      PT SHORT TERM GOAL #3   Title Patient will report at least 50% improvement in overall symptoms and function to demonstrate overall improved functional ability    Baseline 40% function per FOTO    Time 3    Period Weeks    Status New    Target Date 10/14/20             PT Long Term Goals - 09/23/20 1641      PT LONG TERM GOAL #1   Title Patient will improve on FOTO score to meet predicted outcomes to improve functional independence    Baseline 40% function    Time 6    Period Weeks    Status New    Target Date 11/04/20      PT LONG TERM GOAL #2   Title Patient will report pain not exceeding 1/10 during recreational activities, e.g.  walking, swimming    Baseline 6/10 pain with activities    Time 6    Period Weeks    Status New    Target Date 11/04/20                 Plan - 10/02/20 1003    Clinical Impression Statement Overall hip pain has improved. Noted right knee pain that started a couple days ago. Swelling noted around knee, educated patient basic ROM exercises, will follow up with next session. Focused on abdominal strengthening exercises and this was tolerated well. Will continue to focus on core strength as tolerated.    Personal Factors and Comorbidities Profession    Examination-Activity Limitations Squat;Sit;Carry;Lift    Examination-Participation Restrictions Community Activity;Occupation;Yard Work    Stability/Clinical Decision Making Stable/Uncomplicated    Rehab Potential Good    PT Frequency 2x / week    PT Duration 6 weeks    PT Treatment/Interventions ADLs/Self Care Home Management;Aquatic Therapy;Biofeedback;Cryotherapy;Electrical Stimulation;DME Instruction;Ultrasound;Traction;Moist Solicitor;Iontophoresis 87m/ml Dexamethasone;Functional mobility training;Therapeutic activities;Therapeutic exercise;Balance training;Patient/family education;Neuromuscular re-education;Manual techniques;Passive range of motion;Taping;Dry needling;Spinal Manipulations;Joint Manipulations    PT Next Visit Plan Continue with strengthening for right glutes in a pain-free manner.  Trial clam sidelying next session with focus on stability and alignment.    PT Home Exercise Plan prone glute squeeze, muscle energy techniuqe for SI rotation; 12/23: supine clam with GTB; 12/29 TRA activation    Consulted and Agree with Plan of Care Patient           Patient will benefit from skilled therapeutic intervention in order to improve the following deficits and impairments:  Abnormal gait,Decreased activity tolerance,Decreased endurance,Decreased strength,Hypermobility,Difficulty walking,Impaired  perceived functional ability,Improper body mechanics,Pain  Visit Diagnosis: Sacroiliitis ([place]  Difficulty in walking, not elsewhere classified  Right buttock pain  Muscle weakness (generalized)     Problem List Patient Active Problem List   Diagnosis Date Noted  . Genetic testing 02/14/2020  . Family history of gene mutation   .  Family history of breast cancer   . Family history of colon cancer   . Family history of colonic polyps   . Family history of bladder cancer   . Family history of lung cancer   . Family history of basal cell carcinoma   . Chest pain 04/17/2015  . Smoker 01/25/2014  . Sinus tachycardia 07/06/2013    12:25 PM, 10/02/20 Jerene Pitch, DPT Physical Therapy with [facility]  [phone] office   [place] [facility] [address] [place], [place], [address] Phone: [phone]   Fax:  [phone]  Name: Linda [name] MRN: [id number] Date of Birth: 1986-07-31

## 2020-10-07 ENCOUNTER — Ambulatory Visit (HOSPITAL_COMMUNITY): Payer: 59

## 2020-10-09 ENCOUNTER — Ambulatory Visit (HOSPITAL_COMMUNITY): Payer: 59

## 2020-10-09 ENCOUNTER — Encounter (HOSPITAL_COMMUNITY): Payer: 59 | Admitting: Physical Therapy

## 2020-10-10 ENCOUNTER — Ambulatory Visit (HOSPITAL_COMMUNITY): Payer: 59 | Attending: Family Medicine | Admitting: Physical Therapy

## 2020-10-10 ENCOUNTER — Encounter (HOSPITAL_COMMUNITY): Payer: Self-pay | Admitting: Physical Therapy

## 2020-10-10 ENCOUNTER — Other Ambulatory Visit: Payer: Self-pay

## 2020-10-10 DIAGNOSIS — M461 Sacroiliitis, not elsewhere classified: Secondary | ICD-10-CM | POA: Insufficient documentation

## 2020-10-10 DIAGNOSIS — R262 Difficulty in walking, not elsewhere classified: Secondary | ICD-10-CM | POA: Diagnosis not present

## 2020-10-10 DIAGNOSIS — M6281 Muscle weakness (generalized): Secondary | ICD-10-CM | POA: Insufficient documentation

## 2020-10-10 DIAGNOSIS — M7918 Myalgia, other site: Secondary | ICD-10-CM | POA: Diagnosis not present

## 2020-10-10 NOTE — Patient Instructions (Signed)
Access Code: [email or web address] URL: [email or web address] Date: 10/10/2020 Prepared by: Georges Lynch  Exercises Supine March - 2 x daily - 7 x weekly - 2 sets - 10 reps - 5 second hold Supine Hip Adduction Isometric with Ball - 2 x daily - 7 x weekly - 2 sets - 10 reps - second hold Supine Bridge - 2 x daily - 7 x weekly - 2 sets - 10 reps - second hold Active Straight Leg Raise with Quad Set - 2 x daily - 7 x weekly - 2 sets - 10 reps

## 2020-10-10 NOTE — Therapy (Signed)
[place], [place], [address] Phone: [phone]   Fax:  [phone]  Physical Therapy Treatment  Patient Details  Name: Linda [name] MRN: [id number] Date of Birth: [birth year] Referring Provider (PT): Damaris Hippo   Encounter Date: 10/10/2020   PT End of Session - 10/10/20 1004    Visit Number 4    Number of Visits 12    Date for PT Re-Evaluation 11/04/20    Authorization Type [employer], no visit limit, no auth. Medical check after 25th visit    PT Start Time [phone]   late arrival   PT Stop Time 1030    PT Time Calculation (min) 34 min    Activity Tolerance Patient tolerated treatment well    Behavior During Therapy [facility] for tasks assessed/performed           Past Medical History:  Diagnosis Date  . Family history of basal cell carcinoma   . Family history of bladder cancer   . Family history of breast cancer   . Family history of colon cancer   . Family history of colonic polyps   . Family history of gene mutation    BRIP1 gene  . Family history of lung cancer   . Tachycardia    a. 05/2015 Event monitor: sinus rhythm, no significant arrhythmias. Pt activated strips do not correlate to any significant arrhythmia.  . Tobacco abuse     Past Surgical History:  Procedure Laterality Date  . KNEE ARTHROSCOPY     x2    There were no vitals filed for this visit.   Subjective Assessment - 10/10/20 1003    Subjective Patient says she feels the exercises are helping. Feels a little better. Still walks "different" some days which bothers her knee.    Patient Stated Goals Feel better and get back to activity and pain-free work duties    Currently in Pain? Yes    Pain Score 2     Pain Location Hip    Pain Orientation Right;Posterior    Pain Descriptors / Indicators Aching    Pain Type Acute pain    Pain Onset More than a month ago    Pain Frequency Intermittent                              [facility] Adult PT Treatment/Exercise - 10/10/20 0001      Lumbar Exercises: Supine   Ab Set 10 reps;5 seconds    Bent Knee Raise 20 reps    Dead Bug 10 reps    Bridge 10 reps;5 seconds    Straight Leg Raise 10 reps    Other Supine Lumbar Exercises isometric hip abduction/ adduction 10 x 5" each                    PT Short Term Goals - 09/23/20 1638      PT SHORT TERM GOAL #1   Title Patient will be independent with HEP in order to improve functional outcomes.    Baseline in development    Time 3    Period Weeks    Status New    Target Date 10/14/20      PT SHORT TERM GOAL #2   Title Patient will demonstrate 4/5 strength right hip extension and hip abduction with report of 2/10 pain to improve loading response during gait    Baseline 3+/5 with  6/10 pain    Time 3    Period Weeks    Status New    Target Date 10/14/20      PT SHORT TERM GOAL #3   Title Patient will report at least 50% improvement in overall symptoms and function to demonstrate overall improved functional ability    Baseline 40% function per FOTO    Time 3    Period Weeks    Status New    Target Date 10/14/20             PT Long Term Goals - 09/23/20 1641      PT LONG TERM GOAL #1   Title Patient will improve on FOTO score to meet predicted outcomes to improve functional independence    Baseline 40% function    Time 6    Period Weeks    Status New    Target Date 11/04/20      PT LONG TERM GOAL #2   Title Patient will report pain not exceeding 1/10 during recreational activities, e.g. walking, swimming    Baseline 6/10 pain with activities    Time 6    Period Weeks    Status New    Target Date 11/04/20                 Plan - 10/10/20 1029    Clinical Impression Statement Patient tolerated session well today with no increased complaint of pain. Progressed core strengthening with added bent knee raises and deadbugs. Patient cued on proper  form and body mechanics. Patient also required verbal cues for foot and knee positioning during hip bridge. Patient issued updated HEP handout. Patient will continue to benefit from skilled therapy services to progress hip and core strength to reduce SI pain and improve LOF with ADLs.    Personal Factors and Comorbidities Profession    Examination-Activity Limitations Squat;Sit;Carry;Lift    Examination-Participation Restrictions Community Activity;Occupation;Yard Work    Stability/Clinical Decision Making Stable/Uncomplicated    Rehab Potential Good    PT Frequency 2x / week    PT Duration 6 weeks    PT Treatment/Interventions ADLs/Self Care Home Management;Aquatic Therapy;Biofeedback;Cryotherapy;Electrical Stimulation;DME Instruction;Ultrasound;Traction;Moist Solicitor;Iontophoresis 8m/ml Dexamethasone;Functional mobility training;Therapeutic activities;Therapeutic exercise;Balance training;Patient/family education;Neuromuscular re-education;Manual techniques;Passive range of motion;Taping;Dry needling;Spinal Manipulations;Joint Manipulations    PT Next Visit Plan Continue with strengthening for right glutes in a pain-free manner.  Trial clam sidelying next session with focus on stability and alignment.    PT Home Exercise Plan prone glute squeeze, muscle energy techniuqe for SI rotation; 12/23: supine clam with GTB; 12/29 TRA activation    Consulted and Agree with Plan of Care Patient           Patient will benefit from skilled therapeutic intervention in order to improve the following deficits and impairments:  Abnormal gait,Decreased activity tolerance,Decreased endurance,Decreased strength,Hypermobility,Difficulty walking,Impaired perceived functional ability,Improper body mechanics,Pain  Visit Diagnosis: Sacroiliitis (HMiles  Difficulty in walking, not elsewhere classified  Right buttock pain  Muscle weakness (generalized)     Problem List Patient Active  Problem List   Diagnosis Date Noted  . Genetic testing 02/14/2020  . Family history of gene mutation   . Family history of breast cancer   . Family history of colon cancer   . Family history of colonic polyps   . Family history of bladder cancer   . Family history of lung cancer   . Family history of basal cell carcinoma   . Chest pain 04/17/2015  . Smoker 01/25/2014  .  Sinus tachycardia 07/06/2013   10:31 AM, 10/10/20 Josue Hector PT DPT  Physical Therapist with [facility]  [phone] [address] [place], [place], [address] Phone: [phone]   Fax:  [phone]  Name: RATASHA FABRE MRN: [id number] Date of Birth: 1986/06/15

## 2020-10-14 ENCOUNTER — Telehealth (HOSPITAL_COMMUNITY): Payer: Self-pay

## 2020-10-14 ENCOUNTER — Ambulatory Visit (HOSPITAL_COMMUNITY): Payer: 59

## 2020-10-14 NOTE — Telephone Encounter (Signed)
Called, left message about missed appointment and left clinic phone number for scheduling future appointments.  5:04 PM, 10/14/20 M. Shary Decamp, PT, DPT Physical Therapist- [place] Office Number: [phone]

## 2020-10-16 ENCOUNTER — Ambulatory Visit (HOSPITAL_COMMUNITY): Payer: 59

## 2020-10-16 ENCOUNTER — Telehealth (HOSPITAL_COMMUNITY): Payer: Self-pay

## 2020-10-16 NOTE — Telephone Encounter (Signed)
Called left message reminding patient of next appointment and provided clinic phone number.  5:04 PM, 10/16/20 M. Shary Decamp, PT, DPT Physical Therapist- [place] Office Number: [phone]

## 2020-10-21 ENCOUNTER — Ambulatory Visit (HOSPITAL_COMMUNITY): Payer: 59

## 2020-10-23 ENCOUNTER — Telehealth (HOSPITAL_COMMUNITY): Payer: Self-pay

## 2020-10-23 ENCOUNTER — Ambulatory Visit (HOSPITAL_COMMUNITY): Payer: 59

## 2020-10-23 NOTE — Telephone Encounter (Signed)
Called and left message reminding next appointment and clinic phone number to contact for questions or rescheduling. 5:04 PM, 10/23/20 M. Shary Decamp, PT, DPT Physical Therapist- [place] Office Number: [phone]

## 2020-10-28 ENCOUNTER — Telehealth (HOSPITAL_COMMUNITY): Payer: Self-pay

## 2020-10-28 ENCOUNTER — Ambulatory Visit (HOSPITAL_COMMUNITY): Payer: 59

## 2020-10-28 NOTE — Telephone Encounter (Signed)
Called and left message regarding missed appointment and the need to call the clinic to schedule future visits.  5:03 PM, 10/28/20 M. Shary Decamp, PT, DPT Physical Therapist- [place] Office Number: [phone]

## 2020-10-30 ENCOUNTER — Encounter (HOSPITAL_COMMUNITY): Payer: 59 | Admitting: Physical Therapy

## 2020-11-04 ENCOUNTER — Encounter (HOSPITAL_COMMUNITY): Payer: 59 | Admitting: Physical Therapy

## 2020-11-06 ENCOUNTER — Encounter (HOSPITAL_COMMUNITY): Payer: 59 | Admitting: Physical Therapy

## 2020-11-15 DIAGNOSIS — S63635A Sprain of interphalangeal joint of left ring finger, initial encounter: Secondary | ICD-10-CM | POA: Diagnosis not present

## 2020-12-02 DIAGNOSIS — M2550 Pain in unspecified joint: Secondary | ICD-10-CM | POA: Diagnosis not present

## 2020-12-02 DIAGNOSIS — Z6834 Body mass index (BMI) 34.0-34.9, adult: Secondary | ICD-10-CM | POA: Diagnosis not present

## 2020-12-02 DIAGNOSIS — E669 Obesity, unspecified: Secondary | ICD-10-CM | POA: Diagnosis not present

## 2020-12-02 DIAGNOSIS — R5382 Chronic fatigue, unspecified: Secondary | ICD-10-CM | POA: Diagnosis not present

## 2020-12-16 DIAGNOSIS — M1990 Unspecified osteoarthritis, unspecified site: Secondary | ICD-10-CM | POA: Diagnosis not present

## 2020-12-16 DIAGNOSIS — E669 Obesity, unspecified: Secondary | ICD-10-CM | POA: Diagnosis not present

## 2020-12-16 DIAGNOSIS — M79645 Pain in left finger(s): Secondary | ICD-10-CM | POA: Diagnosis not present

## 2020-12-16 DIAGNOSIS — R5382 Chronic fatigue, unspecified: Secondary | ICD-10-CM | POA: Diagnosis not present

## 2020-12-16 DIAGNOSIS — Z6835 Body mass index (BMI) 35.0-35.9, adult: Secondary | ICD-10-CM | POA: Diagnosis not present

## 2020-12-16 DIAGNOSIS — M2550 Pain in unspecified joint: Secondary | ICD-10-CM | POA: Diagnosis not present

## 2021-01-21 DIAGNOSIS — M2550 Pain in unspecified joint: Secondary | ICD-10-CM | POA: Diagnosis not present

## 2021-01-21 DIAGNOSIS — M1990 Unspecified osteoarthritis, unspecified site: Secondary | ICD-10-CM | POA: Diagnosis not present

## 2021-01-21 DIAGNOSIS — M79645 Pain in left finger(s): Secondary | ICD-10-CM | POA: Diagnosis not present

## 2021-01-21 DIAGNOSIS — R5382 Chronic fatigue, unspecified: Secondary | ICD-10-CM | POA: Diagnosis not present

## 2021-05-19 ENCOUNTER — Other Ambulatory Visit (HOSPITAL_COMMUNITY): Payer: Self-pay

## 2021-05-19 DIAGNOSIS — L700 Acne vulgaris: Secondary | ICD-10-CM | POA: Diagnosis not present

## 2021-05-19 DIAGNOSIS — B078 Other viral warts: Secondary | ICD-10-CM | POA: Diagnosis not present

## 2021-05-19 MED ORDER — TRETINOIN 0.05 % EX CREA
TOPICAL_CREAM | CUTANEOUS | 2 refills | Status: DC
  Filled 2021-05-19: qty 45, 30d supply, fill #0

## 2021-05-22 ENCOUNTER — Other Ambulatory Visit (HOSPITAL_COMMUNITY): Payer: Self-pay

## 2021-05-22 DIAGNOSIS — R300 Dysuria: Secondary | ICD-10-CM | POA: Diagnosis not present

## 2021-05-22 DIAGNOSIS — N3001 Acute cystitis with hematuria: Secondary | ICD-10-CM | POA: Diagnosis not present

## 2021-05-22 MED ORDER — NITROFURANTOIN MONOHYD MACRO 100 MG PO CAPS
ORAL_CAPSULE | ORAL | 0 refills | Status: DC
  Filled 2021-05-22: qty 14, 7d supply, fill #0

## 2021-05-22 MED ORDER — PHENAZOPYRIDINE HCL 200 MG PO TABS
ORAL_TABLET | ORAL | 0 refills | Status: DC
  Filled 2021-05-22: qty 6, 2d supply, fill #0

## 2021-05-27 ENCOUNTER — Other Ambulatory Visit (HOSPITAL_COMMUNITY): Payer: Self-pay

## 2021-05-27 MED ORDER — SULFAMETHOXAZOLE-TRIMETHOPRIM 800-160 MG PO TABS
ORAL_TABLET | ORAL | 0 refills | Status: DC
  Filled 2021-05-27: qty 14, 7d supply, fill #0

## 2021-09-05 ENCOUNTER — Encounter (HOSPITAL_COMMUNITY): Payer: Self-pay

## 2021-09-05 NOTE — Therapy (Signed)
[place], [place], [address] Phone: [phone]   Fax:  [phone]  Patient Details  Name: Linda [name] MRN: [id number] Date of Birth: [birth year] Referring Provider:  No ref. provider found  Encounter Date: 09/05/2021 PHYSICAL THERAPY DISCHARGE SUMMARY  Visits from Start of Care: 4  Current functional level related to goals / functional outcomes: Unable to determine, did not return   Remaining deficits: N/A   Education / Equipment: HEP initiated   Patient agrees to discharge. Patient goals were not met. Patient is being discharged due to not returning since the last visit.  8:16 AM, 09/05/21 M. Sherlyn Lees, PT, DPT Physical Therapist- [place] Office Number: [phone]   [place] [address] [place], [place], [address] Phone: [phone]   Fax:  [phone]

## 2022-03-04 ENCOUNTER — Other Ambulatory Visit (HOSPITAL_COMMUNITY): Payer: Self-pay

## 2022-03-04 MED ORDER — ESCITALOPRAM OXALATE 5 MG PO TABS
ORAL_TABLET | ORAL | 0 refills | Status: DC
  Filled 2022-03-04: qty 30, 30d supply, fill #0

## 2022-03-04 MED ORDER — SUMATRIPTAN SUCCINATE 100 MG PO TABS
ORAL_TABLET | ORAL | 1 refills | Status: DC
  Filled 2022-03-04 – 2022-11-18 (×2): qty 9, 30d supply, fill #0
  Filled 2023-01-23: qty 9, 30d supply, fill #1

## 2022-03-12 ENCOUNTER — Other Ambulatory Visit (HOSPITAL_COMMUNITY): Payer: Self-pay

## 2022-04-15 ENCOUNTER — Other Ambulatory Visit (HOSPITAL_COMMUNITY): Payer: Self-pay

## 2022-04-15 MED ORDER — POLYMYXIN B-TRIMETHOPRIM 10000-0.1 UNIT/ML-% OP SOLN
OPHTHALMIC | 0 refills | Status: DC
  Filled 2022-04-15: qty 10, 7d supply, fill #0

## 2022-07-01 ENCOUNTER — Other Ambulatory Visit (HOSPITAL_COMMUNITY): Payer: Self-pay

## 2022-07-01 ENCOUNTER — Other Ambulatory Visit: Payer: Self-pay | Admitting: Pulmonary Disease

## 2022-07-01 ENCOUNTER — Ambulatory Visit (INDEPENDENT_AMBULATORY_CARE_PROVIDER_SITE_OTHER): Payer: No Typology Code available for payment source | Admitting: Pulmonary Disease

## 2022-07-01 ENCOUNTER — Encounter: Payer: Self-pay | Admitting: Pulmonary Disease

## 2022-07-01 ENCOUNTER — Ambulatory Visit (INDEPENDENT_AMBULATORY_CARE_PROVIDER_SITE_OTHER): Payer: No Typology Code available for payment source

## 2022-07-01 VITALS — BP 126/80 | HR 80 | Temp 98.0°F | Ht 65.0 in | Wt 198.0 lb

## 2022-07-01 DIAGNOSIS — J4540 Moderate persistent asthma, uncomplicated: Secondary | ICD-10-CM

## 2022-07-01 DIAGNOSIS — R053 Chronic cough: Secondary | ICD-10-CM

## 2022-07-01 MED ORDER — FLUTICASONE-SALMETEROL 500-50 MCG/ACT IN AEPB
1.0000 | INHALATION_SPRAY | Freq: Two times a day (BID) | RESPIRATORY_TRACT | 6 refills | Status: DC
  Filled 2022-07-01: qty 60, 30d supply, fill #0

## 2022-07-01 MED ORDER — PREDNISONE 20 MG PO TABS
20.0000 mg | ORAL_TABLET | Freq: Every day | ORAL | 0 refills | Status: AC
  Filled 2022-07-01: qty 5, 5d supply, fill #0

## 2022-07-01 NOTE — Progress Notes (Signed)
@Patient  ID: Linda [name], female    DOB: 02/20/1986, 36 y.o.   MRN: [id number]  Chief complaint: cough   Referring provider: Donald Prose, MD  HPI:   36 y.o. woman whom are seen in consultation for evaluation of recurrent chronic cough and recurrent bronchitis.  Patient reports similar symptoms for many many years.  As long as she can remember.  Even dating back to high school.  Bouts of cough.  Bronchitis-like symptoms.  Sometimes productive but eventually dry cough.  Can be present for many weeks at a time.  Up to close to 2 months or more.  Treated in the past by viral infections.  Environmental exposures.  She has a history of seasonal allergies.  No issues with dyspnea in high school.  She was accomplished athlete.  Even now, no dyspnea.  Denies any shortness of breath.  Short of breath after coughing fits but with activities she does fine even in the midst of bronchitis-like symptoms.  Takes a few weeks and just gets better.  No position make things better or worse.  No time of day to make things better or worse.  Environmental factors as mentioned above.  1 time when working with hot pepper, cooking.  No other alleviating or exacerbating factors.  Chest chest x-ray in 2016 on my review interpretation was clear, evidence of hyperinflation on the lateral view.  Chest x-ray obtained today with read from radiologist not available yet but on my review interpretation reveals worsened bronchitic changes particularly in the right lower lobe bronchi as well as similar hyperinflation of the lateral, otherwise clear.  PMH: Seasonal allergies Surgical history: Knee arthroscopy Family history: Mother with hyperlipidemia, breast cancer, mother with CAD, hypertension, hyperlipidemia, colon cancer Social history: Former smoker, quit in 2017, lives in [place], [place] at [facility] / Pulmonary Flowsheets:   ACT:      No data to display          MMRC:     No data to  display          Epworth:      No data to display          Tests:   FENO:  No results found for: "NITRICOXIDE"  PFT:     No data to display          WALK:      No data to display          Imaging: Personally reviewed and as per EMR and discussion this note No results found.  Lab Results: Personally reviewed CBC    Component Value Date/Time   WBC 3.5 (L) 03/25/2019 1103   RBC 5.44 (H) 03/25/2019 1103   HGB 14.3 03/25/2019 1103   HGB 12.2 06/28/2014 1416   HCT 45.2 03/25/2019 1103   HCT 39.0 06/28/2014 1416   PLT 243 03/25/2019 1103   PLT 253 06/28/2014 1416   MCV 83.1 03/25/2019 1103   MCV 88 06/28/2014 1416   MCH 26.3 03/25/2019 1103   MCHC 31.6 03/25/2019 1103   RDW 12.3 03/25/2019 1103   RDW 13.0 06/28/2014 1416   LYMPHSABS 0.9 03/25/2019 1103   MONOABS 0.4 03/25/2019 1103   EOSABS 0.0 03/25/2019 1103   BASOSABS 0.0 03/25/2019 1103    BMET    Component Value Date/Time   NA 134 (L) 03/25/2019 1103   NA 140 06/28/2014 1416   K 3.6 03/25/2019 1103   K 4.1 06/28/2014 1416  CL 100 03/25/2019 1103   CL 107 06/28/2014 1416   CO2 25 03/25/2019 1103   CO2 27 06/28/2014 1416   GLUCOSE 90 03/25/2019 1103   GLUCOSE 109 (H) 06/28/2014 1416   BUN 9 03/25/2019 1103   BUN 11 06/28/2014 1416   CREATININE 0.65 03/25/2019 1103   CREATININE 0.68 06/28/2014 1416   CALCIUM 8.9 03/25/2019 1103   CALCIUM 8.3 (L) 06/28/2014 1416   GFRNONAA >60 03/25/2019 1103   GFRNONAA >60 06/28/2014 1416   GFRAA >60 03/25/2019 1103   GFRAA >60 06/28/2014 1416    BNP No results found for: "BNP"  ProBNP No results found for: "PROBNP"  Specialty Problems   None   Allergies  Allergen Reactions   Penicillins     .Did it involve swelling of the face/tongue/throat, SOB, or low BP? Yes Did it involve sudden or severe rash/hives, skin peeling, or any reaction on the inside of your mouth or nose? Yes Did you need to seek medical attention at a hospital or  doctor's office? {Yes When did it last happen?       If all above answers are "NO", may proceed with cephalosporin use.      There is no immunization history on file for this patient.  Past Medical History:  Diagnosis Date   Family history of basal cell carcinoma    Family history of bladder cancer    Family history of breast cancer    Family history of colon cancer    Family history of colonic polyps    Family history of gene mutation    BRIP1 gene   Family history of lung cancer    Tachycardia    a. 05/2015 Event monitor: sinus rhythm, no significant arrhythmias. Pt activated strips do not correlate to any significant arrhythmia.   Tobacco abuse     Tobacco History: Social History   Tobacco Use  Smoking Status Former   Packs/day: 0.25   Years: 12.00   Total pack years: 3.00   Types: Cigarettes  Smokeless Tobacco Never   Counseling given: Not Answered   Continue to not smoke  Outpatient Encounter Medications as of 07/01/2022  Medication Sig   fluticasone-salmeterol (ADVAIR) 500-50 MCG/ACT AEPB Inhale 1 puff into the lungs in the morning and at bedtime.   predniSONE (DELTASONE) 20 MG tablet Take 1 tablet (20 mg total) by mouth daily with breakfast for 5 days.   SUMAtriptan (IMITREX) 100 MG tablet Take 100 mg by mouth every 2 (two) hours as needed for migraine. May repeat in 2 hours if headache persists or recurs.   SUMAtriptan (IMITREX) 100 MG tablet Take 1 tablet by mouth at the onset of headache, repeat in 2 hours as needed   Albuterol Sulfate (PROAIR RESPICLICK) 768 (90 Base) MCG/ACT AEPB Inhale 2 puffs into the lungs every 4 (four) hours as needed.   amphetamine-dextroamphetamine (ADDERALL) 5 MG tablet Take 5 mg by mouth daily as needed.    escitalopram (LEXAPRO) 5 MG tablet Take 1 tablet by mouth once a day (Patient not taking: Reported on 07/01/2022)   nitrofurantoin, macrocrystal-monohydrate, (MACROBID) 100 MG capsule Take 1 capsule by mouth twice a day 7 days  (Patient not taking: Reported on 07/01/2022)   norgestimate-ethinyl estradiol (ORTHO-CYCLEN) 0.25-35 MG-MCG tablet Take 1 tablet by mouth daily.   ondansetron (ZOFRAN ODT) 4 MG disintegrating tablet 37m ODT q4 hours prn nausea/vomit   phenazopyridine (PYRIDIUM) 200 MG tablet Take 1 tablet by mouth 3 times a day after meals for 2  day(s) (Patient not taking: Reported on 07/01/2022)   sulfamethoxazole-trimethoprim (BACTRIM DS) 800-160 MG tablet Take 1 tablet by mouth 2 times a day for 7 days (Patient not taking: Reported on 07/01/2022)   tretinoin (RETIN-A) 0.05 % cream Apply to face once a day at bedtime (Patient not taking: Reported on 07/01/2022)   trimethoprim-polymyxin b (POLYTRIM) ophthalmic solution Instill 1 drop into affected eye four times a day for 5 days (Patient not taking: Reported on 07/01/2022)   No facility-administered encounter medications on file as of 07/01/2022.     Review of Systems  Review of Systems  No chest pain with exertion.  No orthopnea or PND.  Comprehensive review of systems otherwise negative. Physical Exam  BP 126/80   Pulse 80   Temp 98 F (36.7 C)   Ht 5' 5"  (1.651 m)   Wt 198 lb (89.8 kg)   SpO2 98%   BMI 32.95 kg/m   Wt Readings from Last 5 Encounters:  07/01/22 198 lb (89.8 kg)  03/25/19 205 lb (93 kg)  10/07/18 204 lb (92.5 kg)  11/11/17 195 lb (88.5 kg)  10/25/15 172 lb (78 kg)    BMI Readings from Last 5 Encounters:  07/01/22 32.95 kg/m  03/25/19 34.11 kg/m  10/07/18 33.95 kg/m  11/11/17 32.45 kg/m  10/25/15 28.62 kg/m     Physical Exam General: Sitting in chair, no acute distress Eyes: EOMI, no icterus Neck: Supple, no JVP Pulmonary: Clear, normal work of breathing Cardiovascular: Warm and no edema Abdomen: Nondistended, sounds present MSK: No synovitis, no joint effusion Neuro: Normal gait, no weakness Psych: Normal mood, full affect   Assessment & Plan:   Chronic cough: Episodic and present for weeks to months at a  time.  Some triggers are viral infections etc.  High suspicion for cough variant asthma.  Prednisone 20 mg daily x5 days.  See below.  Asthma: High suspicion given atopic symptoms, recurrent bronchitis that last for weeks or more, mild hyperinflation on chest x-ray, progressive bronchitic changes on chest x-ray compared to 2016.  Start high-dose Advair 1 puff once a day via Diskus.     Return in about 6 months (around 12/30/2022).   Lanier Clam, MD 07/01/2022

## 2022-07-01 NOTE — Patient Instructions (Addendum)
Nice to see you   Advair 1 puff twice a day every day - rinse mouth out good with water after every use  Prednisone 20 mg for 5 days  Return to clinic in 6 months or sooner as needed  Send me a MyChart message with any questions or concerns

## 2022-07-10 IMAGING — MG DIGITAL SCREENING BILAT W/ TOMO W/ CAD
6 of 10 series · 6 of 30 positions shown · non-contrast
Comparison: None.

CLINICAL DATA: Screening.

EXAM:
DIGITAL SCREENING BILATERAL MAMMOGRAM WITH TOMO AND CAD

[L MLO synth-2D]
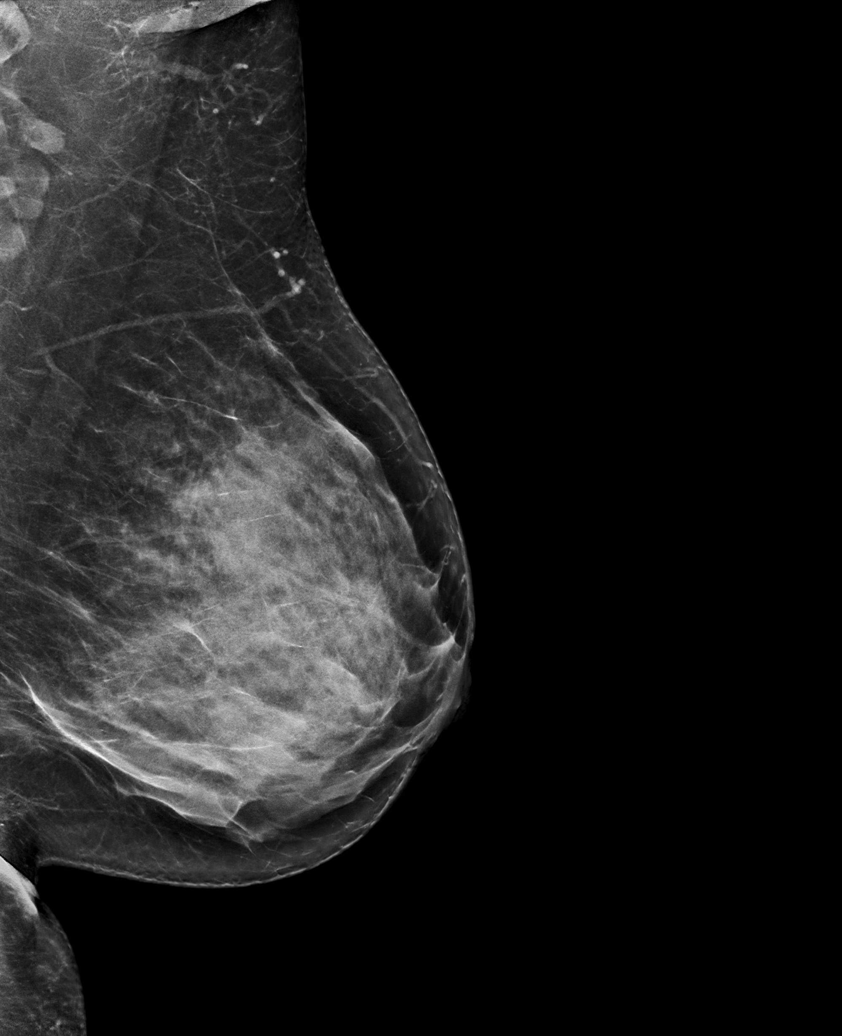

[R XCCL synth-2D]
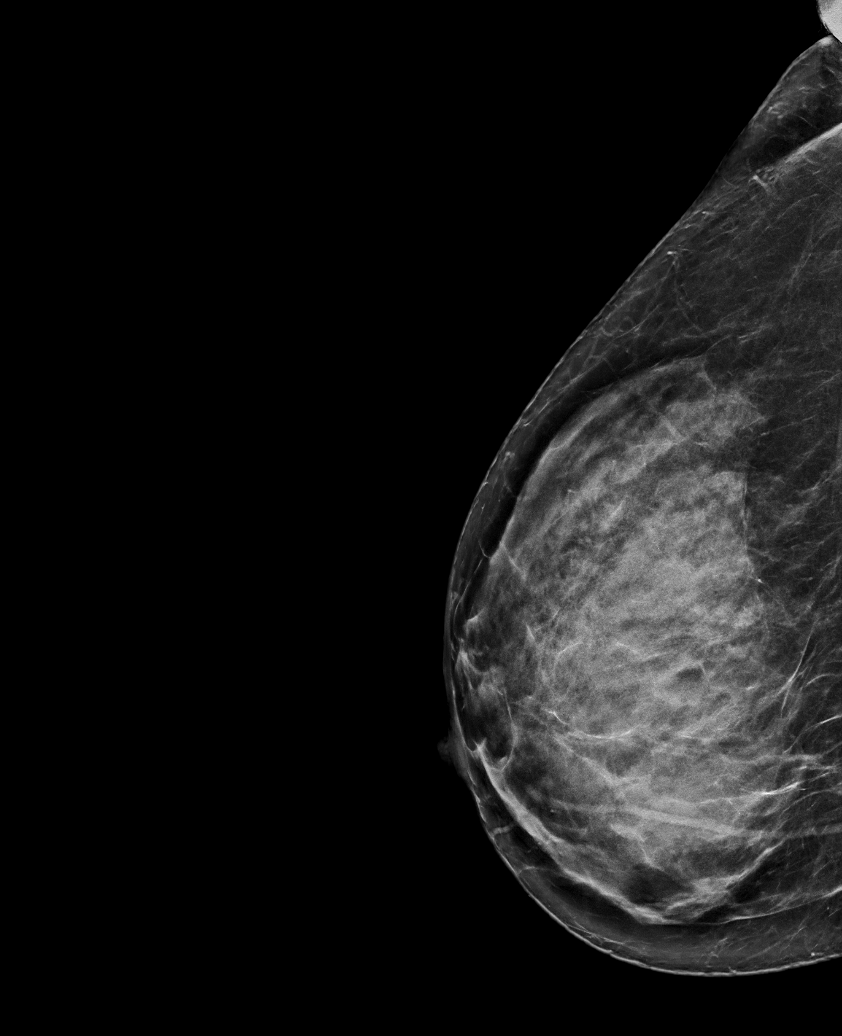

[L CC synth-2D]
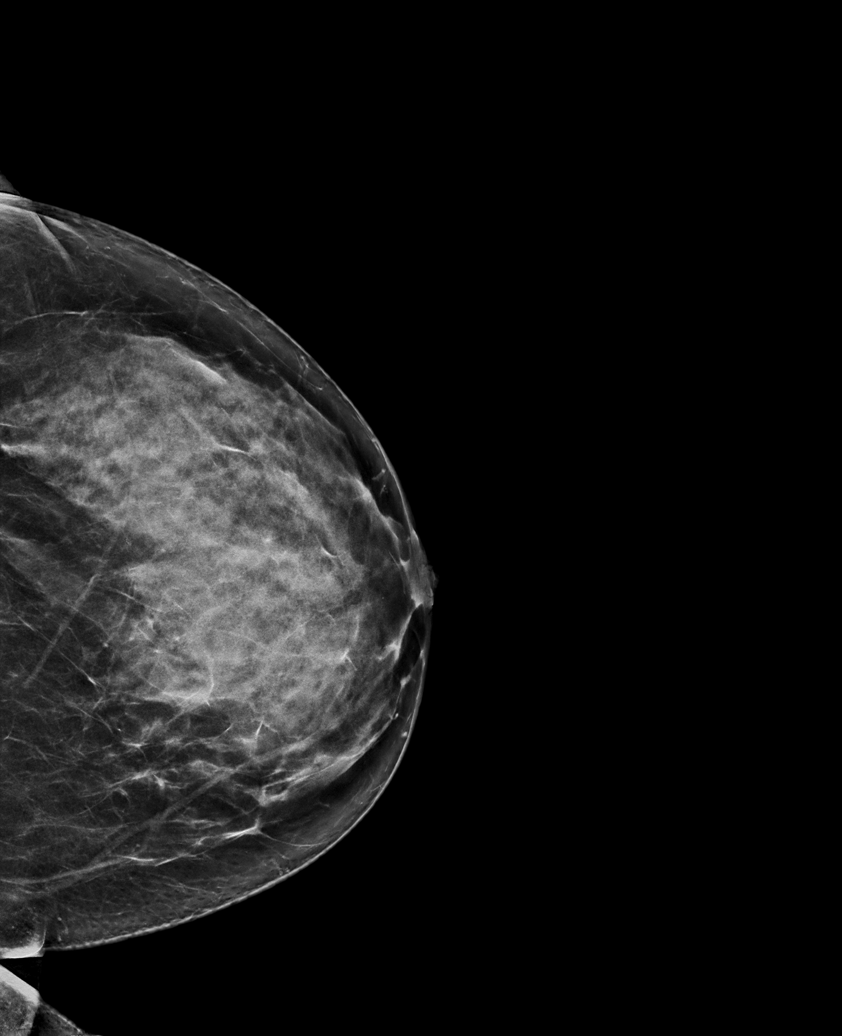

[R MLO synth-2D]
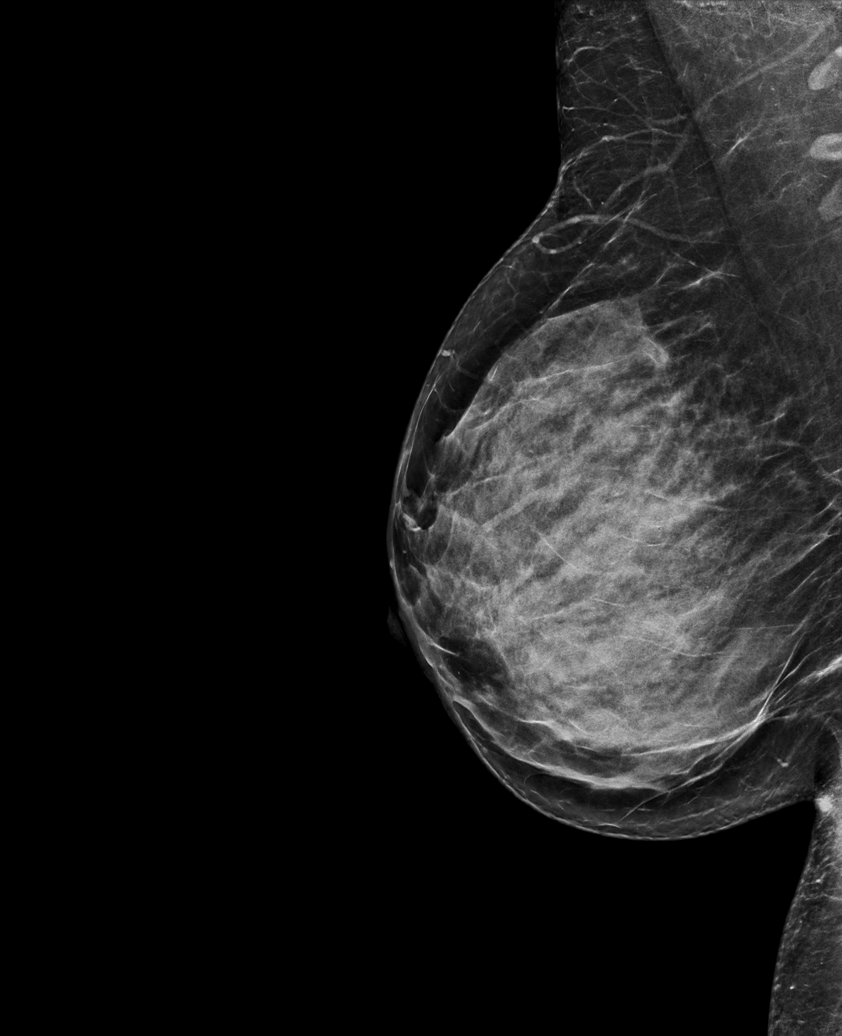

[R CC synth-2D]
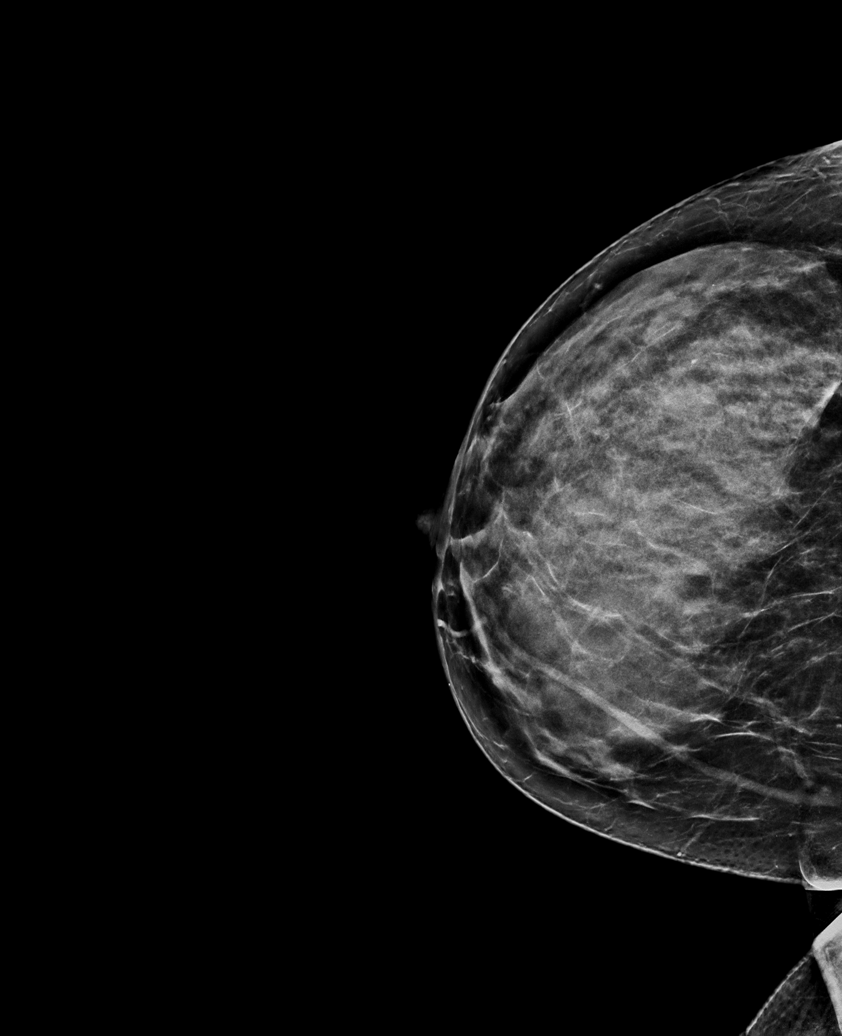

[R XCCL tomo · tomo slice 33/64.0]
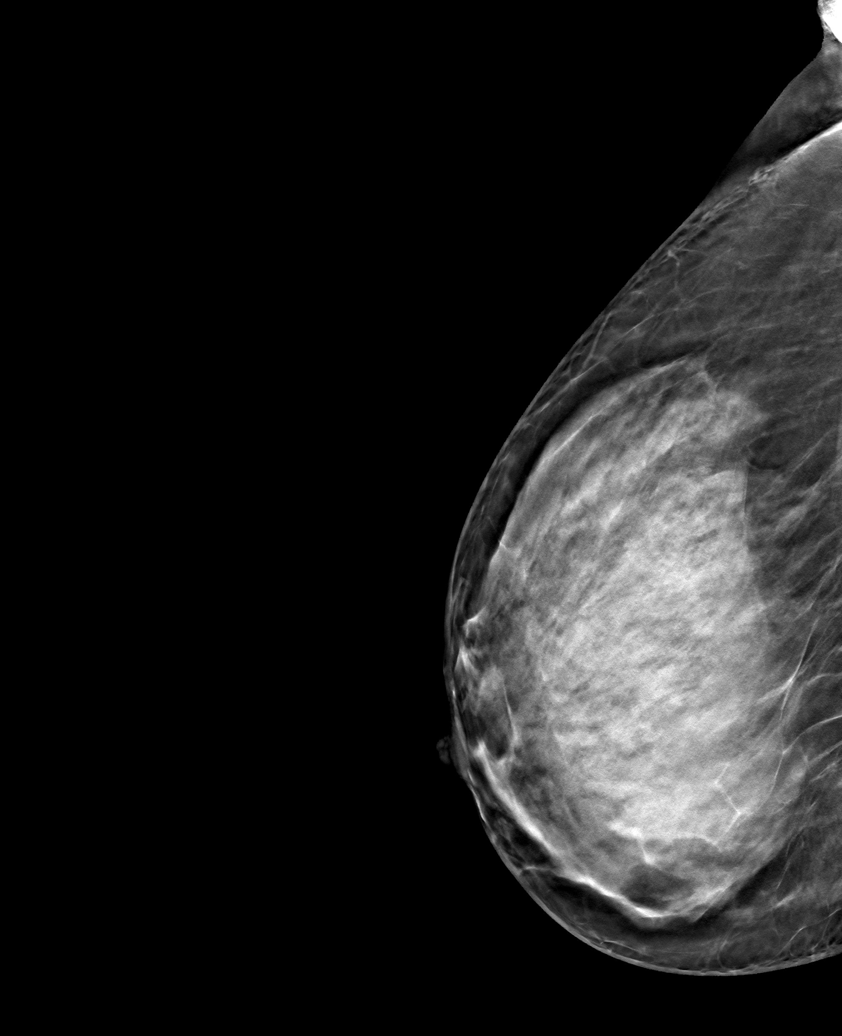

[6 of 30 positions shown; findings below may reference images not displayed]

ACR Breast Density Category c: The breast tissue is heterogeneously
dense, which may obscure small masses.
FINDINGS: In the left breast, a possible asymmetry warrants further
evaluation. In the right breast, no findings suspicious for
malignancy. Images were processed with CAD.
IMPRESSION: Further evaluation is suggested for possible asymmetry in the left
breast.

RECOMMENDATION:
Diagnostic mammogram and possibly ultrasound of the left breast.
(Code:[id number]-[id number])

The patient will be contacted regarding the findings, and additional
imaging will be scheduled.

BI-RADS CATEGORY  0: Incomplete. Need additional imaging evaluation
and/or prior mammograms for comparison.

## 2022-11-10 ENCOUNTER — Other Ambulatory Visit (HOSPITAL_COMMUNITY): Payer: Self-pay

## 2022-11-10 DIAGNOSIS — E6609 Other obesity due to excess calories: Secondary | ICD-10-CM | POA: Diagnosis not present

## 2022-11-10 DIAGNOSIS — Z6832 Body mass index (BMI) 32.0-32.9, adult: Secondary | ICD-10-CM | POA: Diagnosis not present

## 2022-11-10 MED ORDER — WEGOVY 0.25 MG/0.5ML ~~LOC~~ SOAJ
0.5000 mL | SUBCUTANEOUS | 0 refills | Status: DC
  Filled 2022-11-10: qty 2, 28d supply, fill #0

## 2022-11-12 ENCOUNTER — Other Ambulatory Visit (HOSPITAL_COMMUNITY): Payer: Self-pay

## 2022-11-18 ENCOUNTER — Other Ambulatory Visit (HOSPITAL_COMMUNITY): Payer: Self-pay

## 2022-11-19 ENCOUNTER — Other Ambulatory Visit (HOSPITAL_COMMUNITY): Payer: Self-pay

## 2022-12-07 ENCOUNTER — Other Ambulatory Visit (HOSPITAL_COMMUNITY): Payer: Self-pay

## 2022-12-07 MED ORDER — WEGOVY 0.5 MG/0.5ML ~~LOC~~ SOAJ
SUBCUTANEOUS | 0 refills | Status: DC
  Filled 2022-12-07: qty 2, 28d supply, fill #0

## 2022-12-14 ENCOUNTER — Other Ambulatory Visit (HOSPITAL_COMMUNITY): Payer: Self-pay

## 2022-12-14 MED ORDER — CONTRAVE 8-90 MG PO TB12
ORAL_TABLET | ORAL | 0 refills | Status: AC
  Filled 2022-12-14: qty 35, 30d supply, fill #0

## 2022-12-20 ENCOUNTER — Ambulatory Visit: Payer: Self-pay

## 2022-12-21 ENCOUNTER — Other Ambulatory Visit (HOSPITAL_COMMUNITY): Payer: Self-pay

## 2022-12-23 ENCOUNTER — Other Ambulatory Visit (HOSPITAL_COMMUNITY): Payer: Self-pay

## 2022-12-25 ENCOUNTER — Other Ambulatory Visit (HOSPITAL_COMMUNITY): Payer: Self-pay

## 2022-12-25 MED ORDER — CONTRAVE 8-90 MG PO TB12
1.0000 | ORAL_TABLET | Freq: Two times a day (BID) | ORAL | 2 refills | Status: DC
  Filled 2022-12-25: qty 60, 30d supply, fill #0
  Filled 2023-01-23 – 2023-04-13 (×2): qty 60, 30d supply, fill #1

## 2022-12-28 ENCOUNTER — Other Ambulatory Visit (HOSPITAL_COMMUNITY): Payer: Self-pay

## 2023-02-01 ENCOUNTER — Other Ambulatory Visit (HOSPITAL_COMMUNITY): Payer: Self-pay

## 2023-03-23 DIAGNOSIS — H5213 Myopia, bilateral: Secondary | ICD-10-CM | POA: Diagnosis not present

## 2023-04-05 ENCOUNTER — Telehealth: Payer: Self-pay | Admitting: Pulmonary Disease

## 2023-04-05 ENCOUNTER — Other Ambulatory Visit (HOSPITAL_COMMUNITY): Payer: Self-pay

## 2023-04-05 MED ORDER — AZITHROMYCIN 250 MG PO TABS
ORAL_TABLET | ORAL | 0 refills | Status: DC
  Filled 2023-04-05: qty 6, 5d supply, fill #0

## 2023-04-05 NOTE — Telephone Encounter (Signed)
Weeks of sinus pressure, congestion, drainage.  Z-Pak sent to local pharmacy.

## 2023-04-07 ENCOUNTER — Other Ambulatory Visit (HOSPITAL_COMMUNITY): Payer: Self-pay

## 2023-04-07 MED ORDER — SUMATRIPTAN SUCCINATE 100 MG PO TABS
100.0000 mg | ORAL_TABLET | ORAL | 0 refills | Status: DC | PRN
  Filled 2023-04-07: qty 18, 30d supply, fill #0

## 2023-04-14 ENCOUNTER — Other Ambulatory Visit: Payer: Self-pay

## 2023-04-15 ENCOUNTER — Other Ambulatory Visit (HOSPITAL_COMMUNITY): Payer: Self-pay

## 2023-04-21 ENCOUNTER — Other Ambulatory Visit (HOSPITAL_COMMUNITY): Payer: Self-pay

## 2023-11-30 DIAGNOSIS — S233XXA Sprain of ligaments of thoracic spine, initial encounter: Secondary | ICD-10-CM | POA: Diagnosis not present

## 2023-11-30 DIAGNOSIS — S134XXA Sprain of ligaments of cervical spine, initial encounter: Secondary | ICD-10-CM | POA: Diagnosis not present

## 2023-11-30 DIAGNOSIS — S338XXA Sprain of other parts of lumbar spine and pelvis, initial encounter: Secondary | ICD-10-CM | POA: Diagnosis not present

## 2023-12-23 ENCOUNTER — Other Ambulatory Visit (HOSPITAL_COMMUNITY): Payer: Self-pay

## 2023-12-23 MED ORDER — BENZONATATE 100 MG PO CAPS
100.0000 mg | ORAL_CAPSULE | Freq: Three times a day (TID) | ORAL | 0 refills | Status: DC
  Filled 2023-12-23: qty 30, 10d supply, fill #0

## 2024-01-05 ENCOUNTER — Other Ambulatory Visit (HOSPITAL_COMMUNITY): Payer: Self-pay

## 2024-06-06 ENCOUNTER — Institutional Professional Consult (permissible substitution) (INDEPENDENT_AMBULATORY_CARE_PROVIDER_SITE_OTHER): Payer: Self-pay | Admitting: Nurse Practitioner

## 2024-06-06 ENCOUNTER — Institutional Professional Consult (permissible substitution): Admitting: Family Medicine

## 2024-06-20 ENCOUNTER — Encounter (INDEPENDENT_AMBULATORY_CARE_PROVIDER_SITE_OTHER): Payer: Self-pay | Admitting: Nurse Practitioner

## 2024-06-20 ENCOUNTER — Ambulatory Visit (INDEPENDENT_AMBULATORY_CARE_PROVIDER_SITE_OTHER): Admitting: Nurse Practitioner

## 2024-06-20 VITALS — BP 127/84 | HR 89 | Temp 98.7°F | Ht 65.0 in | Wt 207.0 lb

## 2024-06-20 DIAGNOSIS — E559 Vitamin D deficiency, unspecified: Secondary | ICD-10-CM

## 2024-06-20 DIAGNOSIS — E7800 Pure hypercholesterolemia, unspecified: Secondary | ICD-10-CM | POA: Diagnosis not present

## 2024-06-20 DIAGNOSIS — E66811 Obesity, class 1: Secondary | ICD-10-CM

## 2024-06-20 DIAGNOSIS — R7303 Prediabetes: Secondary | ICD-10-CM

## 2024-06-20 DIAGNOSIS — Z6834 Body mass index (BMI) 34.0-34.9, adult: Secondary | ICD-10-CM

## 2024-06-20 NOTE — Progress Notes (Signed)
 [address] [place], [place], [place] [address] Office: [phone]  /  Fax: [phone]   Initial Consultation    Linda [name] was seen in clinic today to evaluate for obesity. She is interested in losing weight to improve overall health and reduce the risk of weight related complications. She presents today to review program treatment options, initial physical assessment, and evaluation.    Aretta has dealt with weight gain after stopping smoking 8 years ago. She has used Ozempic  in the past for 6 weeks until insurance changed- she did lose 15 pounds but weight returned once she stopped the medication. She then was prescribed Contrave  but did not take regularly and did not lose weight.  Approximately 10 months ago she lost her mom and gained 30-35 pounds with depression.  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  Previously she worked as an Insurance underwriter. She is not currently exercising.  She does have hypertension and is starting on hydrochlorothiazide 25 mg today for her BP.  She also has a history of pure hypercholesterolemia, prediabetes, Vit D deficiency and elevated TSH- none are currently treated with medication.  Anthropometrics and Bioimpedance Analysis   Body mass index is 34.45 kg/m. Body Fat Mass : 41.4 % Visceral Fat Mass Rating : 9   Obesity Related Diseases and Complications  Obesity Quality of Life and Psychosocial Complications: Reduced health-related quality of life and Decrease physical activity and social participation  Cardiometabolic: Prediabetes and/or insulin resistance and Dyslipidemia or hypercholesterolemia  Biomechanical: Low back pain and GERD   Weight Related History  She was referred by: PCP  When asked what they would like to accomplish? She states: Adopt a healthier eating pattern and lifestyle, Improve energy levels and physical activity, Improve existing medical conditions, Improve quality of life, Improve appearance,  Improve self-confidence, and Lose 50 lbs  Weight history: Weight gain occurred in the last 8 years after she quit smoking.  She smoked 17 year pack history  Highest weight: 207  Contributing factors: family history of obesity, disruption of circadian rhythm / sleep disordered breathing, consumption of processed foods, strong orexigenic signaling and/or inadequate inhibitory control , multiple weight loss attempts in the past, sedentary job, need for convenient foods, and self - critic or all-or-none mindset  Prior weight loss attempts: Tracking and Journaling  Current or previous pharmacotherapy: Other: Contrave  8/90mg  - did not take regularly. Was on Ozempic  6 weeks and stopped with insurance- 15 pounds  Response to medication: Lost weight initially but was unable to sustain weight loss and Was cost prohibitive or lost coverage for AOM  Current nutrition plan: None  Greatest challenge with dieting: difficulty maintaining reduced calorie state.  Current level of physical activity: None  Barriers to Exercise: motivation  Readiness and Motivation  On a scale from 0 to 10 How ready are you to make changes to your eating and physical activity to lose weight? 7 How important is it for you to lose weight right now ? 8 How confident are you that you can lose weight if you try? 3  Past Medical History   Past Medical History:  Diagnosis Date   Family history of basal cell carcinoma    Family history of bladder cancer    Family history of breast cancer    Family history of colon cancer    Family history of colonic polyps    Family history of gene mutation    BRIP1 gene   Family  history of lung cancer    Tachycardia    a. 05/2015 Event monitor: sinus rhythm, no significant arrhythmias. Pt activated strips do not correlate to any significant arrhythmia.   Tobacco abuse      Objective    BP 127/84   Pulse 89   Temp 98.7 F (37.1 C)   Ht 5' 5 (1.651 m)   Wt 207 lb (93.9 kg)    SpO2 99%   BMI 34.45 kg/m  She was weighed on the bioimpedance scale: Body mass index is 34.45 kg/m.    General:  Alert, oriented and cooperative. Patient is in no acute distress.  Respiratory: Normal respiratory effort, no problems with respiration noted   Gait: able to ambulate independently  Mental Status: Normal mood and affect. Normal behavior. Normal judgment and thought content.   Diagnostic Data Reviewed  BMET    Component Value Date/Time   NA 134 (L) 03/25/2019 1103   NA 140 06/28/2014 1416   K 3.6 03/25/2019 1103   K 4.1 06/28/2014 1416   CL 100 03/25/2019 1103   CL 107 06/28/2014 1416   CO2 25 03/25/2019 1103   CO2 27 06/28/2014 1416   GLUCOSE 90 03/25/2019 1103   GLUCOSE 109 (H) 06/28/2014 1416   BUN 9 03/25/2019 1103   BUN 11 06/28/2014 1416   CREATININE 0.65 03/25/2019 1103   CREATININE 0.68 06/28/2014 1416   CALCIUM 8.9 03/25/2019 1103   CALCIUM 8.3 (L) 06/28/2014 1416   GFRNONAA >60 03/25/2019 1103   GFRNONAA >60 06/28/2014 1416   GFRAA >60 03/25/2019 1103   GFRAA >60 06/28/2014 1416   No results found for: HGBA1C No results found for: INSULIN CBC    Component Value Date/Time   WBC 3.5 (L) 03/25/2019 1103   RBC 5.44 (H) 03/25/2019 1103   HGB 14.3 03/25/2019 1103   HGB 12.2 06/28/2014 1416   HCT 45.2 03/25/2019 1103   HCT 39.0 06/28/2014 1416   PLT 243 03/25/2019 1103   PLT 253 06/28/2014 1416   MCV 83.1 03/25/2019 1103   MCV 88 06/28/2014 1416   MCH 26.3 03/25/2019 1103   MCHC 31.6 03/25/2019 1103   RDW 12.3 03/25/2019 1103   RDW 13.0 06/28/2014 1416   Iron/TIBC/Ferritin/ %Sat No results found for: IRON, TIBC, FERRITIN, IRONPCTSAT Lipid Panel     Component Value Date/Time   CHOL 212 (H) 02/11/2015 0817   TRIG 114 02/11/2015 0817   HDL 56 02/11/2015 0817   CHOLHDL 3.8 02/11/2015 0817   LDLCALC 133 (H) 02/11/2015 0817   Hepatic Function Panel     Component Value Date/Time   PROT 7.5 03/25/2019 1103   ALBUMIN 3.5  03/25/2019 1103   AST 27 03/25/2019 1103   ALT 24 03/25/2019 1103   ALKPHOS 70 03/25/2019 1103   BILITOT 0.5 03/25/2019 1103   No results found for: TSH  Medications  Outpatient Encounter Medications as of 06/20/2024  Medication Sig   azithromycin  (ZITHROMAX ) 250 MG tablet Take 2 tablets by mouth once daily on day 1, then take 1 tablet once daily on days 2 through 5   benzonatate  (TESSALON ) 100 MG capsule Take 1 capsule (100 mg total) by mouth 3 (three) times daily as needed   escitalopram  (LEXAPRO ) 5 MG tablet Take 1 tablet by mouth once a day (Patient not taking: Reported on 07/01/2022)   fluticasone -salmeterol (ADVAIR ) 500-50 MCG/ACT AEPB Inhale 1 puff into the lungs in the morning and at bedtime.   Naltrexone -buPROPion  HCl ER (CONTRAVE ) 8-90 MG TB12  Take 1 tablet by mouth 2 (two) times daily.   phenazopyridine  (PYRIDIUM ) 200 MG tablet Take 1 tablet by mouth 3 times a day after meals for 2 day(s) (Patient not taking: Reported on 06/20/2024)   SUMAtriptan  (IMITREX ) 100 MG tablet Take 100 mg by mouth every 2 (two) hours as needed for migraine. May repeat in 2 hours if headache persists or recurs.   SUMAtriptan  (IMITREX ) 100 MG tablet Take 1 tablet (100 mg) by mouth at the onset of headache, repeat in 2 hours as needed. Need office visit for further refills.   tretinoin  (RETIN-A ) 0.05 % cream Apply to face once a day at bedtime (Patient not taking: Reported on 07/01/2022)   No facility-administered encounter medications on file as of 06/20/2024.     Assessment and Plan   Pure hypercholesterolemia  Prediabetes A1c 5.8 05/25/24  Vitamin D deficiency  Class 1 obesity with serious comorbidity and body mass index (BMI) of 34.0 to 34.9 in adult, unspecified obesity type       Obesity Treatment and Action Plan:  Patient will work on garnering support from family and friends to begin weight loss journey. Will work on eliminating or reducing the presence of highly palatable, calorie dense  foods in the home. Will complete provided nutritional and psychosocial assessment questionnaire before the next appointment. Will be scheduled for indirect calorimetry to determine resting energy expenditure in a fasting state.  This will allow us  to create a reduced calorie, high-protein meal plan to promote loss of fat mass while preserving muscle mass. Counseled on the health benefits of losing 5%-15% of total body weight. Was counseled on nutritional approaches to weight loss and benefits of reducing processed foods and consuming plant-based foods and high quality protein as part of nutritional weight management. Was counseled on pharmacotherapy and role as an adjunct in weight management.   Education and Additional resources  She was weighed on the bioimpedance scale and results were discussed and documented in the synopsis.  We discussed obesity as a progressive, chronic disease and the importance of a more detailed evaluation of all the factors contributing to the disease.  We reviewed the basic principles in obesity management.   We discussed the importance of long term lifestyle changes which include nutrition, exercise and behavioral modification as well as the importance of customizing this to her specific health and social needs.  We reviewed the role of medical interventions including pharmacotherapy and surgical interventions.   We discussed the benefits of reaching a healthier weight to alleviate the symptoms of existing conditions and reduce the risks of the biomechanical, cardiometabolic and psychological effects of obesity.  We reviewed our program approach and philosophy, which are guided by the four pillars of obesity medicine.  We discussed how to prepare for intake appointment and the importance of fasting and avoidance of stimulants for at least 8 hours prior to indirect calorimetry.  Eve D Papania appears to be in the action stage of change and reports being ready to  initiate intensive lifestyle and behavioral modifications as part of their weight loss journey.  Attestation  Reviewed by clinician on day of visit: allergies, medications, problem list, medical history, surgical history, family history, social history, and previous encounter notes pertinent to obesity diagnosis.  I have spent 35 minutes in the care of the patient today including: 7 minutes before the visit reviewing and preparing the chart. 22 minutes face-to-face assessing and reviewing listed medical problems as outlined in obesity care plan, providing nutritional and  behavioral counseling on topics outlined in the obesity care plan, independently interpreting test results and goals of care, as described in assessment and plan, reviewing and discussing biometric information and progress, and reviewing latest PCP notes and specialist consultations 6 minutes after the visit updating chart and documentation of encounter.  Mariane Burpee ANP-C
